# Patient Record
Sex: Male | Born: 1980 | Race: Black or African American | Hispanic: No | Marital: Married | Smoking: Never smoker
Health system: Southern US, Community
[De-identification: ages and names within clinical notes are randomized; demographics above are authoritative.]

## PROBLEM LIST (undated history)

## (undated) DIAGNOSIS — M109 Gout, unspecified: Secondary | ICD-10-CM

## (undated) DIAGNOSIS — I10 Essential (primary) hypertension: Secondary | ICD-10-CM

## (undated) HISTORY — DX: Essential (primary) hypertension: I10

---

## 2016-03-19 ENCOUNTER — Telehealth: Payer: Self-pay | Admitting: Cardiovascular Disease

## 2016-03-19 NOTE — Telephone Encounter (Signed)
Records received from [facility] for apt on 04/09/16 with Dr Duke Salviaandolph, [facility] (medical records) CN

## 2016-04-03 NOTE — Progress Notes (Signed)
Cardiology Office Note   Date:  04/04/2016   ID:  Steven Leon, DOB 07/20/1986, MRN [id number]  PCP:  Arnette Felts  Cardiologist:   Chilton Si, MD   Chief Complaint  Patient presents with  . New Evaluation    ref by Dr Allyne Gee for abnormal EKG, pt denied chest pain and SOB      History of Present Illness: Steven Leon is a 35 y.o. male who presents for evaluation Of an abnormal EKG.  Steven Leon saw Arnette Felts, FNP, on 03/12/16 and was noted to have an abnormal EKG. There was an incomplete right bundle branch block and lateral ST elevations. He was completely asymptomatic at the time. He was noted to have poorly controlled blood pressure and was started on azilsartan-chlorthalidone.  Prior to starting this medication he did note fatigue and headaches. His blood pressure at that appointment was 128/78.  It had been as high as 166/123.  Since starting the anti-hypertensive he has been feeling well. He has not noted any chest pain or shortness of breath. He also denies lower extremity edema, orthopnea, or PND. He does not exercise regularly but does either go running or playing basketball inconsistently. He denies exertional symptoms.    Steven Leon reports that he has been under a lot of stress lately.  Both his mother and mother-in-law are living with him and his wife. He has a daughter who is in college in [place] [employer].   Past Medical History:  Diagnosis Date  . Essential hypertension 04/04/2016  . Hypertension     History reviewed. No pertinent surgical history.   Current Outpatient Prescriptions  Medication Sig Dispense Refill  . Azilsartan-Chlorthalidone (EDARBYCLOR) 40-12.5 MG TABS Take 1 tablet by mouth daily.    . cetirizine (ZYRTEC) 10 MG tablet Take 10 mg by mouth daily as needed for allergies.     No current facility-administered medications for this visit.     Allergies:   Review of patient's allergies indicates no known allergies.    Social  History:  The patient  reports that he has never smoked. He has never used smokeless tobacco. He reports that he drinks about 0.6 oz of alcohol per week .   Family History:  The patient's family history includes Cancer in his maternal grandmother; Cancer - Prostate in his maternal grandfather; Diabetes in his maternal aunt, maternal grandmother, maternal uncle, mother, and paternal grandmother; Hypertension in his paternal grandmother; Seizures in his father.    ROS:  Please see the history of present illness.   Otherwise, review of systems are positive for none.   All other systems are reviewed and negative.    PHYSICAL EXAM: VS:  BP 117/83   Pulse 61   Ht 5\' 6"  (1.676 m)   Wt 220 lb 6.4 oz (100 kg)   BMI 35.57 kg/m  , BMI Body mass index is 35.57 kg/m. GENERAL:  Well appearing HEENT:  Pupils equal round and reactive, fundi not visualized, oral mucosa unremarkable NECK:  No jugular venous distention, waveform within normal limits, carotid upstroke brisk and symmetric, no bruits, no thyromegaly LYMPHATICS:  No cervical adenopathy LUNGS:  Clear to auscultation bilaterally HEART:  RRR.  PMI not displaced or sustained,S1 and S2 within normal limits, no S3, no S4, no clicks, no rubs, no murmurs ABD:  Flat, positive bowel sounds normal in frequency in pitch, no bruits, no rebound, no guarding, no midline pulsatile mass, no hepatomegaly, no splenomegaly EXT:  2 plus pulses throughout,  no edema, no cyanosis no clubbing SKIN:  No rashes no nodules NEURO:  Cranial nerves II through XII grossly intact, motor grossly intact throughout PSYCH:  Cognitively intact, oriented to person place and time   EKG:  EKG is ordered today. The ekg ordered today demonstrates sinus rhythm. Rate 61 bpm. Borderline LVH. 03/12/16: Sinus rhythm. Rate 63 bpm. Mild repolarization in the 90s.   Recent Labs: No results found for requested labs within last 8760 hours.    Lipid Panel No results found for: CHOL,  TRIG, HDL, CHOLHDL, VLDL, LDLCALC, LDLDIRECT    Wt Readings from Last 3 Encounters:  04/04/16 220 lb 6.4 oz (100 kg)      ASSESSMENT AND PLAN:  # Abnormal EKG: EKG is likely consistent with mild LVH in the setting of poorly-controlled hypertension.  There are no acute changes and he is completely   # Hypertension: Blood pressure is well-controlled on azilsartan and chlorthalidone.    Current medicines are reviewed at length with the patient today.  The patient does not have concerns regarding medicines.  The following changes have been made:  no change  Labs/ tests ordered today include:  No orders of the defined types were placed in this encounter.    Disposition:   FU with Nial Hawe C. Duke Salviaandolph, MD, [facility] as needed.     This note was written with the assistance of speech recognition software.  Please excuse any transcriptional errors.  Signed, Khai Arrona C. Duke Salviaandolph, MD, [facility]  04/04/2016 1:00 PM    [facility]

## 2016-04-04 ENCOUNTER — Encounter: Payer: Self-pay | Admitting: Cardiovascular Disease

## 2016-04-04 ENCOUNTER — Ambulatory Visit (INDEPENDENT_AMBULATORY_CARE_PROVIDER_SITE_OTHER): Payer: BLUE CROSS/BLUE SHIELD | Admitting: Cardiovascular Disease

## 2016-04-04 VITALS — BP 117/83 | HR 61 | Ht 66.0 in | Wt 220.4 lb

## 2016-04-04 DIAGNOSIS — I10 Essential (primary) hypertension: Secondary | ICD-10-CM | POA: Diagnosis not present

## 2016-04-04 DIAGNOSIS — R9431 Abnormal electrocardiogram [ECG] [EKG]: Secondary | ICD-10-CM

## 2016-04-04 HISTORY — DX: Essential (primary) hypertension: I10

## 2016-04-04 NOTE — Patient Instructions (Signed)
Medication Instructions:  ?Your physician recommends that you continue on your current medications as directed. Please refer to the Current Medication list given to you today.  ? ?Labwork: ?NONE ? ?Testing/Procedures: ?NONE ? ?Follow-Up: ?AS NEEDED  ? ?  ?

## 2018-03-06 DIAGNOSIS — Z6841 Body Mass Index (BMI) 40.0 and over, adult: Secondary | ICD-10-CM

## 2018-03-06 DIAGNOSIS — M109 Gout, unspecified: Secondary | ICD-10-CM | POA: Diagnosis not present

## 2018-03-06 DIAGNOSIS — Z0000 Encounter for general adult medical examination without abnormal findings: Secondary | ICD-10-CM

## 2018-03-06 DIAGNOSIS — I10 Essential (primary) hypertension: Secondary | ICD-10-CM

## 2018-03-06 DIAGNOSIS — E669 Obesity, unspecified: Secondary | ICD-10-CM | POA: Diagnosis not present

## 2018-09-04 ENCOUNTER — Encounter: Payer: Self-pay | Admitting: Nurse Practitioner

## 2018-09-04 ENCOUNTER — Ambulatory Visit: Payer: No Typology Code available for payment source | Admitting: Nurse Practitioner

## 2018-09-04 ENCOUNTER — Other Ambulatory Visit: Payer: Self-pay

## 2018-09-04 VITALS — BP 148/86 | Temp 98.2°F | Ht 66.0 in | Wt 234.2 lb

## 2018-09-04 DIAGNOSIS — I10 Essential (primary) hypertension: Secondary | ICD-10-CM | POA: Diagnosis not present

## 2018-09-04 MED ORDER — OLMESARTAN MEDOXOMIL-HCTZ 40-12.5 MG PO TABS: 1.0000 | ORAL_TABLET | Freq: Every day | ORAL | 1 refills | Status: DC

## 2018-09-04 NOTE — Progress Notes (Signed)
Subjective:     Patient ID: Steven Leon , male    DOB: Jan 03, 1981 , 38 y.o.   MRN: [id number]   Chief Complaint  Patient presents with  . Hypertension    HPI  Hypertension  This is a chronic problem. The current episode started more than 1 year ago. The problem has been gradually improving since onset. The problem is uncontrolled. Pertinent negatives include no anxiety or headaches. There are no associated agents to hypertension. Risk factors for coronary artery disease include obesity and male gender. Past treatments include angiotensin blockers and diuretics. The current treatment provides mild improvement. There are no compliance problems.  There is no history of angina. There is no history of chronic renal disease.     Past Medical History:  Diagnosis Date  . Essential hypertension 04/04/2016  . Hypertension      Family History  Problem Relation Age of Onset  . Diabetes Mother   . Seizures Father   . Cancer Maternal Grandmother   . Diabetes Maternal Grandmother   . Cancer - Prostate Maternal Grandfather   . Diabetes Paternal Grandmother   . Hypertension Paternal Grandmother   . Diabetes Maternal Aunt   . Diabetes Maternal Uncle      Current Outpatient Medications:  .  cetirizine (ZYRTEC) 10 MG tablet, Take 10 mg by mouth daily as needed for allergies., Disp: , Rfl:  .  Azilsartan-Chlorthalidone (EDARBYCLOR) 40-12.5 MG TABS, Take 1 tablet by mouth daily., Disp: , Rfl:    No Known Allergies   Review of Systems  Eyes: Negative.   Respiratory: Negative.   Cardiovascular: Negative.   Musculoskeletal: Negative.   Neurological: Negative for dizziness and headaches.  Psychiatric/Behavioral: Negative.      Today's Vitals   09/04/18 0955  BP: (!) 148/86  Temp: 98.2 F (36.8 C)  TempSrc: Oral  Weight: 234 lb 3.2 oz (106.2 kg)  Height: '5\' 6"'$  (1.676 m)   Body mass index is 37.8 kg/m.   Objective:  Physical Exam Vitals signs reviewed.  Constitutional:    Appearance: Normal appearance.  Cardiovascular:     Rate and Rhythm: Normal rate and regular rhythm.     Pulses: Normal pulses.     Heart sounds: Normal heart sounds. No murmur.  Pulmonary:     Effort: Pulmonary effort is normal. No respiratory distress.     Breath sounds: Normal breath sounds.  Skin:    Capillary Refill: Capillary refill takes less than 2 seconds.  Neurological:     General: No focal deficit present.  Psychiatric:        Mood and Affect: Mood normal.        Behavior: Behavior normal.        Thought Content: Thought content normal.        Judgment: Judgment normal.         Assessment And Plan:   1. Essential hypertension . B/P is poorly controlled  . BMP ordered to check renal function.  . I have advised him to limit his intake of salt and increase his water intake . The importance of regular exercise and dietary modification was stressed to the patient.  . Stressed importance of losing ten percent of her body weight to help with B/P control.  . The weight loss would help with decreasing cardiac and cancer risk as well.  - BMP8+eGFR - olmesartan-hydrochlorothiazide (BENICAR HCT) 40-12.5 MG tablet; Take 1 tablet by mouth daily.  Dispense: 90 tablet; Refill: [place], FNP

## 2018-09-05 LAB — BMP8+EGFR
BUN/Creatinine Ratio: 8 — ABNORMAL LOW (ref 9–20)
BUN: 9 mg/dL (ref 6–20)
CALCIUM: 9.2 mg/dL (ref 8.7–10.2)
CHLORIDE: 104 mmol/L (ref 96–106)
CO2: 24 mmol/L (ref 20–29)
CREATININE: 1.07 mg/dL (ref 0.76–1.27)
GFR calc Af Amer: 101 mL/min/{1.73_m2} (ref >59)
GFR calc non Af Amer: 87 mL/min/{1.73_m2} (ref >59)
GLUCOSE: 91 mg/dL (ref 65–99)
Potassium: 4.3 mmol/L (ref 3.5–5.2)
Sodium: 142 mmol/L (ref 134–144)

## 2018-09-10 ENCOUNTER — Telehealth: Payer: Self-pay

## 2018-09-10 NOTE — Telephone Encounter (Signed)
-----   Message from Arnette Felts, FNP sent at 09/10/2018 12:19 PM EDT ----- Kidney functions are normal.

## 2019-02-26 ENCOUNTER — Ambulatory Visit (HOSPITAL_COMMUNITY)
Admission: EM | Admit: 2019-02-26 | Discharge: 2019-02-26 | Disposition: A | Discharge status: Home / Self Care | Payer: 59 | Attending: Family Medicine | Admitting: Family Medicine

## 2019-02-26 ENCOUNTER — Ambulatory Visit (INDEPENDENT_AMBULATORY_CARE_PROVIDER_SITE_OTHER): Payer: 59

## 2019-02-26 ENCOUNTER — Encounter (HOSPITAL_COMMUNITY): Payer: Self-pay | Admitting: Emergency Medicine

## 2019-02-26 ENCOUNTER — Other Ambulatory Visit: Payer: Self-pay

## 2019-02-26 DIAGNOSIS — S93402A Sprain of unspecified ligament of left ankle, initial encounter: Secondary | ICD-10-CM | POA: Diagnosis not present

## 2019-02-26 HISTORY — DX: Gout, unspecified: M10.9

## 2019-02-26 MED ORDER — MELOXICAM 7.5 MG PO TBDP: 7.5000 mg | ORAL_TABLET | Freq: Every day | ORAL | 0 refills | Status: DC

## 2019-02-26 NOTE — Discharge Instructions (Signed)
Your findings do appear related to strain to the ankle, likely from your step while running.  Brace for support, may use ACE wrap as needed as well.  Ice, elevation for pain.  Meloxicam daily, take with food. Don't take additional ibuprofen.  I would recommend following up with podiatry or foot center for further recommendations as this may help with appropriate shoes/insoles for prevention of injury.  Light activity as tolerated, limit weight bearing until pain improves.

## 2019-02-26 NOTE — ED Provider Notes (Signed)
[facility]    CSN: [id number] Arrival date & time: 02/26/19  1447      History   Chief Complaint Chief Complaint  Patient presents with  . Foot Pain    HPI Steven Leon is a 38 y.o. male.   Steven Leon presents with complaints of left foot/ankle pain. Last week he started running again after he has been inactive for a longer period of time. Two days ago he noted slight pain but yesterday night he woke up with increased pain. Worse with weight bearing. Has been taking ibuprofen as well as colchicine which have helped some. Pain 5/10 with weight bearing, otherwise 2/10. Some swelling to lateral foot at ankle. No specific known injury. Has new running shoes. No numbness or tingling. No knee pain. Denies any previous ankle or foot injury. History  Of gout to right foot/toe/knee but states this feels different. He states his is "pigeon toed" and feels that when he runs he does have some inversion, with most of his weight landing on his lateral foot. He feels this is worse with his running shoes. History  Of htn, gout.     ROS per HPI, negative if not otherwise mentioned.      Past Medical History:  Diagnosis Date  . Essential hypertension 04/04/2016  . Gout   . Hypertension     Patient Active Problem List   Diagnosis Date Noted  . Essential hypertension 04/04/2016    History reviewed. No pertinent surgical history.     Home Medications    Prior to Admission medications   Medication Sig Start Date End Date Taking? Authorizing Provider  cetirizine (ZYRTEC) 10 MG tablet Take 10 mg by mouth daily as needed for allergies.    [provider]  colchicine 0.6 MG tablet TAKE 1 TABLET BY MOUTH BID IF NEEDED FOR GOUT 12/24/18   [provider]  Meloxicam 7.5 MG TBDP Take 7.5 mg by mouth daily. 02/26/19   Georgetta HaberBurky, Clearence Vitug B, NP  olmesartan-hydrochlorothiazide (BENICAR HCT) 40-12.5 MG tablet Take 1 tablet by mouth daily. 09/04/18   Arnette FeltsMoore, Janece, FNP     Family History Family History  Problem Relation Age of Onset  . Diabetes Mother   . Seizures Father   . Cancer Maternal Grandmother   . Diabetes Maternal Grandmother   . Cancer - Prostate Maternal Grandfather   . Diabetes Paternal Grandmother   . Hypertension Paternal Grandmother   . Diabetes Maternal Aunt   . Diabetes Maternal Uncle     Social History Social History   Tobacco Use  . Smoking status: Never Smoker  . Smokeless tobacco: Never Used  Substance Use Topics  . Alcohol use: Yes    Alcohol/week: 1.0 standard drinks    Types: 1 Cans of beer per week  . Drug use: Not on file     Allergies   Patient has no known allergies.   Review of Systems Review of Systems   Physical Exam Triage Vital Signs ED Triage Vitals  Enc Vitals Group     BP 02/26/19 1507 118/73     Pulse Rate 02/26/19 1507 93     Resp 02/26/19 1507 18     Temp 02/26/19 1507 98.4 F (36.9 C)     Temp src --      SpO2 02/26/19 1507 100 %     Weight --      Height --      Head Circumference --      Peak Flow --  Pain Score 02/26/19 1508 5     Pain Loc --      Pain Edu? --      Excl. in GC? --    No data found.  Updated Vital Signs BP 118/73   Pulse 93   Temp 98.4 F (36.9 C)   Resp 18   SpO2 100%   Visual Acuity Right Eye Distance:   Left Eye Distance:   Bilateral Distance:    Right Eye Near:   Left Eye Near:    Bilateral Near:     Physical Exam Constitutional:      Appearance: He is well-developed.  Cardiovascular:     Rate and Rhythm: Normal rate.  Pulmonary:     Effort: Pulmonary effort is normal.  Musculoskeletal:     Left ankle: He exhibits swelling. He exhibits normal range of motion, no ecchymosis, no deformity, no laceration and normal pulse.       Feet:     Comments: Soft tissue tenderness and swelling to lateral left ankle, pain with ankle extension, mild pain with dorsiflexion; no other foot pain or heel pain; no redness or warmth; no bony tenderness;  strong pedal pulse; cap refill < 2 seconds    Skin:    General: Skin is warm and dry.  Neurological:     Mental Status: He is alert and oriented to person, place, and time.      UC Treatments / Results  Labs (all labs ordered are listed, but only abnormal results are displayed) Labs Reviewed - No data to display  EKG   Radiology Dg Foot Complete Left  Result Date: 02/26/2019 CLINICAL DATA:  Foot pain EXAM: LEFT FOOT - COMPLETE 3+ VIEW COMPARISON:  None. FINDINGS: There is no evidence of fracture or dislocation. There is no evidence of arthropathy or other focal bone abnormality. Soft tissues are unremarkable. IMPRESSION: Negative. Electronically Signed   By: Katherine Mantle M.D.   On: 02/26/2019 15:46    Procedures Procedures (including critical care time)  Medications Ordered in UC Medications - No data to display  Initial Impression / Assessment and Plan / UC Course  I have reviewed the triage vital signs and the nursing notes.  Pertinent labs & imaging results that were available during my care of the patient were reviewed by me and considered in my medical decision making (see chart for details).     I feel his symptoms are likely related to strain to left ankle from abnormal gait with running. Likely needs different shoes/insoles for better support. Brace provided today, ice, elevation nsaids recommended. Follow up with podiatry as needed. Patient verbalized understanding and agreeable to plan.   Final Clinical Impressions(s) / UC Diagnoses   Final diagnoses:  Sprain of left ankle, unspecified ligament, initial encounter     Discharge Instructions     Your findings do appear related to strain to the ankle, likely from your step while running.  Brace for support, may use ACE wrap as needed as well.  Ice, elevation for pain.  Meloxicam daily, take with food. Don't take additional ibuprofen.  I would recommend following up with podiatry or foot center for  further recommendations as this may help with appropriate shoes/insoles for prevention of injury.  Light activity as tolerated, limit weight bearing until pain improves.    ED Prescriptions    Medication Sig Dispense Auth. Provider   Meloxicam 7.5 MG TBDP Take 7.5 mg by mouth daily. 20 tablet Georgetta Haber, NP  Controlled Substance Prescriptions [place] Controlled Substance Registry consulted? Not Applicable   Zigmund Gottron, NP 02/26/19 1556

## 2019-02-26 NOTE — ED Triage Notes (Addendum)
Pt states he started having L foot pain Wednesday that progressively got worse. Denies specific injury. Pt states hes been running a lot more and hes gotten new shoes, pt also has hx of gout, but not in his L foot. States it doesn't feel like gout. Pt took his gout medicine yesterday and today without relief.

## 2019-03-09 ENCOUNTER — Ambulatory Visit: Payer: 59 | Admitting: Nurse Practitioner

## 2019-03-09 ENCOUNTER — Other Ambulatory Visit: Payer: Self-pay

## 2019-03-09 ENCOUNTER — Encounter: Payer: Self-pay | Admitting: Nurse Practitioner

## 2019-03-09 VITALS — BP 132/88 | HR 62 | Temp 97.5°F | Ht 66.0 in | Wt 243.8 lb

## 2019-03-09 DIAGNOSIS — E669 Obesity, unspecified: Secondary | ICD-10-CM

## 2019-03-09 DIAGNOSIS — I10 Essential (primary) hypertension: Secondary | ICD-10-CM | POA: Diagnosis not present

## 2019-03-09 DIAGNOSIS — M25572 Pain in left ankle and joints of left foot: Secondary | ICD-10-CM

## 2019-03-09 DIAGNOSIS — Z0000 Encounter for general adult medical examination without abnormal findings: Secondary | ICD-10-CM

## 2019-03-09 DIAGNOSIS — E559 Vitamin D deficiency, unspecified: Secondary | ICD-10-CM

## 2019-03-09 DIAGNOSIS — Z1211 Encounter for screening for malignant neoplasm of colon: Secondary | ICD-10-CM

## 2019-03-09 LAB — POCT URINALYSIS DIPSTICK
Bilirubin, UA: NEGATIVE
Blood, UA: NEGATIVE
Glucose, UA: NEGATIVE
Ketones, UA: NEGATIVE
Leukocytes, UA: NEGATIVE
Nitrite, UA: NEGATIVE
Protein, UA: NEGATIVE
Spec Grav, UA: 1.025 (ref 1.010–1.025)
Urobilinogen, UA: 0.2 E.U./dL
pH, UA: 5.5 (ref 5.0–8.0)

## 2019-03-09 LAB — POCT UA - MICROALBUMIN
Albumin/Creatinine Ratio, Urine, POC: 30
Creatinine, POC: 300 mg/dL
Microalbumin Ur, POC: 10 mg/L

## 2019-03-09 NOTE — Progress Notes (Signed)
Subjective:     Patient ID: Steven Leon , male    DOB: 07/25/1980 , 38 y.o.   MRN: [id number]   Chief Complaint  Patient presents with  . Annual Exam    HPI Men's preventive visit. Patient Health Questionnaire (PHQ-2) is    Office Visit from 03/09/2019 in [facility]  PHQ-2 Total Score  0     Patient is on a regular diet. Marital status: Married. Relevant history for alcohol use is:  Social History   Substance and Sexual Activity  Alcohol Use Yes  . Alcohol/week: 1.0 standard drinks  . Types: 1 Cans of beer per week   Relevant history for tobacco use is:  Social History   Tobacco Use  Smoking Status Never Smoker  Smokeless Tobacco Never Used  Exercising - 1-2 times per week.  More on lower end, with brisk walks in the evening.   Here for HM.    3 weeks ago while running and did something to his left ankle.  Last Thursday was unable to put weight on it. Given medications and brace but continues to have pain.  If not better would like to go to orthopedics  Wt Readings from Last 3 Encounters: 03/09/19 : 243 lb 12.8 oz (110.6 kg) 09/04/18 : 234 lb 3.2 oz (106.2 kg) 04/04/16 : 220 lb 6.4 oz (100 kg)   Hypertension This is a chronic problem. The current episode started more than 1 year ago. The problem is unchanged. The problem is uncontrolled. Pertinent negatives include no anxiety, chest pain, headaches or palpitations. There are no associated agents to hypertension. Risk factors for coronary artery disease include obesity, sedentary lifestyle and male gender. Past treatments include angiotensin blockers and diuretics. There are no compliance problems.  There is no history of angina. There is no history of chronic renal disease or a hypertension causing med.     Past Medical History:  Diagnosis Date  . Essential hypertension 04/04/2016  . Gout   . Hypertension      Family History  Problem Relation Age of Onset  . Diabetes Mother   . Seizures  Father   . Cancer Maternal Grandmother   . Diabetes Maternal Grandmother   . Cancer - Prostate Maternal Grandfather   . Diabetes Paternal Grandmother   . Hypertension Paternal Grandmother   . Diabetes Maternal Aunt   . Diabetes Maternal Uncle      Current Outpatient Medications:  .  cetirizine (ZYRTEC) 10 MG tablet, Take 10 mg by mouth daily as needed for allergies., Disp: , Rfl:  .  colchicine 0.6 MG tablet, TAKE 1 TABLET BY MOUTH BID IF NEEDED FOR GOUT, Disp: , Rfl:  .  olmesartan-hydrochlorothiazide (BENICAR HCT) 40-12.5 MG tablet, Take 1 tablet by mouth daily., Disp: 90 tablet, Rfl: 1 .  Meloxicam 7.5 MG TBDP, Take 7.5 mg by mouth daily., Disp: 20 tablet, Rfl: 0   No Known Allergies   Review of Systems  Constitutional: Negative.   HENT: Negative.   Eyes: Negative.   Respiratory: Negative.   Cardiovascular: Negative.  Negative for chest pain, palpitations and leg swelling.  Endocrine: Negative.   Genitourinary: Negative.   Musculoskeletal: Negative.   Neurological: Negative.  Negative for dizziness and headaches.  Hematological: Negative.   Psychiatric/Behavioral: Negative.      Today's Vitals   03/09/19 0937  BP: 132/88  Pulse: 62  Temp: (!) 97.5 F (36.4 C)  TempSrc: Oral  Weight: 243 lb 12.8 oz (110.6 kg)  Height:  5\' 6"  (1.676 m)   Body mass index is 39.35 kg/m.   Objective:  Physical Exam Vitals signs reviewed.  Constitutional:      Appearance: Normal appearance. He is obese.  HENT:     Head: Normocephalic and atraumatic.     Right Ear: Tympanic membrane, ear canal and external ear normal. There is no impacted cerumen.     Left Ear: Tympanic membrane, ear canal and external ear normal. There is no impacted cerumen.  Eyes:     Extraocular Movements: Extraocular movements intact.     Conjunctiva/sclera: Conjunctivae normal.     Pupils: Pupils are equal, round, and reactive to light.  Neck:     Musculoskeletal: Normal range of motion and neck supple.   Cardiovascular:     Rate and Rhythm: Normal rate and regular rhythm.     Pulses: Normal pulses.     Heart sounds: Normal heart sounds. No murmur.  Pulmonary:     Effort: Pulmonary effort is normal. No respiratory distress.     Breath sounds: Normal breath sounds.  Abdominal:     General: Abdomen is flat. Bowel sounds are normal. There is no distension.     Palpations: Abdomen is soft.  Genitourinary:    Prostate: Normal.     Rectum: Guaiac result negative.  Musculoskeletal: Normal range of motion.        General: Tenderness (left ankle with brace in place) present.  Skin:    General: Skin is warm and dry.     Capillary Refill: Capillary refill takes less than 2 seconds.  Neurological:     General: No focal deficit present.     Mental Status: He is alert and oriented to person, place, and time.  Psychiatric:        Mood and Affect: Mood normal.        Behavior: Behavior normal.        Thought Content: Thought content normal.        Judgment: Judgment normal.         Assessment And Plan:     1. Health maintenance examination . Behavior modifications discussed and diet history reviewed.   . Pt will continue to exercise regularly and modify diet with low GI, plant based foods and decrease intake of processed foods.  . Recommend intake of daily multivitamin, Vitamin D, and calcium.  . Recommend for preventive screenings, as well as recommend immunizations that include influenza, TDAP  2. Essential hypertension  Chronic, fair control  Encouraged to make sure staying well hydrated  Will check GFR for kidney function  EKG, no changes to previous - POCT Urinalysis Dipstick (81002) - POCT UA - Microalbumin - EKG 12-Lead  3. Acute left ankle pain  Left ankle brace in place  He will see how he is feeling in another week will return call for referral to orthopedics  He was seen at urgent care approximately 2 weeks ago and thought to have an ankle sprain  4. Obesity  (BMI 30-39.9)  Chronic  9 lb weight gain since last visit  He is trying to make small changes  He recently started back exercising and injured his left foot   Arnette FeltsJanece Jene Huq, FNP    THE PATIENT IS ENCOURAGED TO PRACTICE SOCIAL DISTANCING DUE TO THE COVID-19 PANDEMIC.

## 2019-03-10 LAB — CMP14 + ANION GAP
ALT: 48 IU/L — ABNORMAL HIGH (ref 0–44)
AST: 29 IU/L (ref 0–40)
Albumin/Globulin Ratio: 1.3 (ref 1.2–2.2)
Albumin: 4.2 g/dL (ref 4.0–5.0)
Alkaline Phosphatase: 157 IU/L — ABNORMAL HIGH (ref 39–117)
Anion Gap: 16 mmol/L (ref 10.0–18.0)
BUN/Creatinine Ratio: 13 (ref 9–20)
BUN: 15 mg/dL (ref 6–20)
Bilirubin Total: 0.3 mg/dL (ref 0.0–1.2)
CO2: 23 mmol/L (ref 20–29)
Calcium: 9.5 mg/dL (ref 8.7–10.2)
Chloride: 102 mmol/L (ref 96–106)
Creatinine, Ser: 1.12 mg/dL (ref 0.76–1.27)
GFR calc Af Amer: 95 mL/min/{1.73_m2} (ref >59)
GFR calc non Af Amer: 82 mL/min/{1.73_m2} (ref >59)
Globulin, Total: 3.2 g/dL (ref 1.5–4.5)
Glucose: 88 mg/dL (ref 65–99)
Potassium: 4.3 mmol/L (ref 3.5–5.2)
Sodium: 141 mmol/L (ref 134–144)
Total Protein: 7.4 g/dL (ref 6.0–8.5)

## 2019-03-10 LAB — LIPID PANEL
Chol/HDL Ratio: 3 ratio (ref 0.0–5.0)
Cholesterol, Total: 153 mg/dL (ref 100–199)
HDL: 51 mg/dL (ref >39)
LDL Chol Calc (NIH): 80 mg/dL (ref 0–99)
Triglycerides: 121 mg/dL (ref 0–149)
VLDL Cholesterol Cal: 22 mg/dL (ref 5–40)

## 2019-03-10 LAB — HEMOGLOBIN A1C
Est. average glucose Bld gHb Est-mCnc: 126 mg/dL
Hgb A1c MFr Bld: 6 % — ABNORMAL HIGH (ref 4.8–5.6)

## 2019-03-10 LAB — VITAMIN D 25 HYDROXY (VIT D DEFICIENCY, FRACTURES): Vit D, 25-Hydroxy: 17.9 ng/mL — ABNORMAL LOW (ref 30.0–100.0)

## 2019-03-10 MED ORDER — VITAMIN D (ERGOCALCIFEROL) 1.25 MG (50000 UNIT) PO CAPS: 50000.0000 [IU] | ORAL_CAPSULE | ORAL | 0 refills | Status: DC

## 2019-03-12 ENCOUNTER — Encounter: Payer: Self-pay | Admitting: Nurse Practitioner

## 2019-03-15 NOTE — Patient Instructions (Signed)
Health Maintenance  Topic Date Due  . INFLUENZA VACCINE  09/22/2019 (Originally 01/23/2019)  . TETANUS/TDAP  03/12/2026  . HIV Screening  Completed   Health Maintenance, Male Adopting a healthy lifestyle and getting preventive care are important in promoting health and wellness. Ask your health care provider about:  The right schedule for you to have regular tests and exams.  Things you can do on your own to prevent diseases and keep yourself healthy. What should I know about diet, weight, and exercise? Eat a healthy diet   Eat a diet that includes plenty of vegetables, fruits, low-fat dairy products, and lean protein.  Do not eat a lot of foods that are high in solid fats, added sugars, or sodium. Maintain a healthy weight Body mass index (BMI) is a measurement that can be used to identify possible weight problems. It estimates body fat based on height and weight. Your health care provider can help determine your BMI and help you achieve or maintain a healthy weight. Get regular exercise Get regular exercise. This is one of the most important things you can do for your health. Most adults should:  Exercise for at least 150 minutes each week. The exercise should increase your heart rate and make you sweat (moderate-intensity exercise).  Do strengthening exercises at least twice a week. This is in addition to the moderate-intensity exercise.  Spend less time sitting. Even light physical activity can be beneficial. Watch cholesterol and blood lipids Have your blood tested for lipids and cholesterol at 39 years of age, then have this test every 5 years. You may need to have your cholesterol levels checked more often if:  Your lipid or cholesterol levels are high.  You are older than 39 years of age.  You are at high risk for heart disease. What should I know about cancer screening? Many types of cancers can be detected early and may often be prevented. Depending on your health  history and family history, you may need to have cancer screening at various ages. This may include screening for:  Colorectal cancer.  Prostate cancer.  Skin cancer.  Lung cancer. What should I know about heart disease, diabetes, and high blood pressure? Blood pressure and heart disease  High blood pressure causes heart disease and increases the risk of stroke. This is more likely to develop in people who have high blood pressure readings, are of African descent, or are overweight.  Talk with your health care provider about your target blood pressure readings.  Have your blood pressure checked: ? Every 3-5 years if you are 5318-39 years of age. ? Every year if you are 39 years old or older.  If you are between the ages of 2665 and 6175 and are a current or former smoker, ask your health care provider if you should have a one-time screening for abdominal aortic aneurysm (AAA). Diabetes Have regular diabetes screenings. This checks your fasting blood sugar level. Have the screening done:  Once every three years after age 145 if you are at a normal weight and have a low risk for diabetes.  More often and at a younger age if you are overweight or have a high risk for diabetes. What should I know about preventing infection? Hepatitis B If you have a higher risk for hepatitis B, you should be screened for this virus. Talk with your health care provider to find out if you are at risk for hepatitis B infection. Hepatitis C Blood testing is recommended for:  Everyone born from [birth year] through 1966.  Anyone with known risk factors for hepatitis C. Sexually transmitted infections (STIs)  You should be screened each year for STIs, including gonorrhea and chlamydia, if: ? You are sexually active and are younger than 39 years of age. ? You are older than 39 years of age and your health care provider tells you that you are at risk for this type of infection. ? Your sexual activity has changed since  you were last screened, and you are at increased risk for chlamydia or gonorrhea. Ask your health care provider if you are at risk.  Ask your health care provider about whether you are at high risk for HIV. Your health care provider may recommend a prescription medicine to help prevent HIV infection. If you choose to take medicine to prevent HIV, you should first get tested for HIV. You should then be tested every 3 months for as long as you are taking the medicine. Follow these instructions at home: Lifestyle  Do not use any products that contain nicotine or tobacco, such as cigarettes, e-cigarettes, and chewing tobacco. If you need help quitting, ask your health care provider.  Do not use street drugs.  Do not share needles.  Ask your health care provider for help if you need support or information about quitting drugs. Alcohol use  Do not drink alcohol if your health care provider tells you not to drink.  If you drink alcohol: ? Limit how much you have to 0-2 drinks a day. ? Be aware of how much alcohol is in your drink. In the [place] one drink equals one 12 oz bottle of beer (355 mL), one 5 oz glass of wine (148 mL), or one 1 oz glass of hard liquor (44 mL). General instructions  Schedule regular health, dental, and eye exams.  Stay current with your vaccines.  Tell your health care provider if: ? You often feel depressed. ? You have ever been abused or do not feel safe at home. Summary  Adopting a healthy lifestyle and getting preventive care are important in promoting health and wellness.  Follow your health care provider's instructions about healthy diet, exercising, and getting tested or screened for diseases.  Follow your health care provider's instructions on monitoring your cholesterol and blood pressure. This information is not intended to replace advice given to you by your health care provider. Make sure you discuss any questions you have with your health care  provider. Document Released: 12/07/2007 Document Revised: 06/03/2018 Document Reviewed: 06/03/2018 Elsevier Patient Education  2020 [employer].

## 2019-06-21 ENCOUNTER — Ambulatory Visit: Payer: Self-pay | Admitting: Nurse Practitioner

## 2019-06-22 ENCOUNTER — Ambulatory Visit: Payer: Self-pay | Admitting: Nurse Practitioner

## 2019-07-14 ENCOUNTER — Telehealth: Payer: Self-pay

## 2019-07-14 ENCOUNTER — Other Ambulatory Visit: Payer: Self-pay | Admitting: Nurse Practitioner

## 2019-07-14 DIAGNOSIS — I10 Essential (primary) hypertension: Secondary | ICD-10-CM

## 2019-07-14 NOTE — Telephone Encounter (Signed)
Left vm- pt needs to schedule appt for further refills

## 2019-08-24 ENCOUNTER — Other Ambulatory Visit: Payer: Self-pay | Admitting: Nurse Practitioner

## 2019-08-24 DIAGNOSIS — I10 Essential (primary) hypertension: Secondary | ICD-10-CM

## 2019-08-30 ENCOUNTER — Other Ambulatory Visit: Payer: Self-pay | Admitting: Nurse Practitioner

## 2019-08-30 DIAGNOSIS — M25569 Pain in unspecified knee: Secondary | ICD-10-CM

## 2019-08-30 MED ORDER — COLCHICINE 0.6 MG PO TABS: ORAL_TABLET | ORAL | 0 refills | Status: DC

## 2019-08-31 ENCOUNTER — Ambulatory Visit: Payer: 59 | Admitting: Nurse Practitioner

## 2019-08-31 ENCOUNTER — Encounter: Payer: Self-pay | Admitting: Nurse Practitioner

## 2019-08-31 ENCOUNTER — Other Ambulatory Visit: Payer: Self-pay

## 2019-08-31 VITALS — BP 124/80 | HR 87 | Temp 97.8°F | Ht 66.4 in | Wt 250.6 lb

## 2019-08-31 DIAGNOSIS — I10 Essential (primary) hypertension: Secondary | ICD-10-CM

## 2019-08-31 DIAGNOSIS — E559 Vitamin D deficiency, unspecified: Secondary | ICD-10-CM | POA: Diagnosis not present

## 2019-08-31 DIAGNOSIS — R7309 Other abnormal glucose: Secondary | ICD-10-CM

## 2019-08-31 DIAGNOSIS — M25569 Pain in unspecified knee: Secondary | ICD-10-CM

## 2019-08-31 MED ORDER — OLMESARTAN MEDOXOMIL-HCTZ 40-12.5 MG PO TABS: 1.0000 | ORAL_TABLET | Freq: Every day | ORAL | 1 refills | Status: DC

## 2019-08-31 MED ORDER — VITAMIN D (ERGOCALCIFEROL) 1.25 MG (50000 UNIT) PO CAPS: 50000.0000 [IU] | ORAL_CAPSULE | ORAL | 1 refills | Status: DC

## 2019-08-31 NOTE — Progress Notes (Signed)
This visit occurred during the SARS-CoV-2 public health emergency.  Safety protocols were in place, including screening questions prior to the visit, additional usage of staff PPE, and extensive cleaning of exam room while observing appropriate contact time as indicated for disinfecting solutions.  Subjective:     Patient ID: Steven Leon , male    DOB: 1981-05-22 , 39 y.o.   MRN: [id number]   Chief Complaint  Patient presents with  . Hypertension  . Knee Pain    patient went to [facility] and had his knee drained they told him they want him to have an ultrasound on his knee and an MRI    HPI  Wt Readings from Last 3 Encounters: 08/31/19 : 250 lb 9.6 oz (113.7 kg) 03/09/19 : 243 lb 12.8 oz (110.6 kg) 09/04/18 : 234 lb 3.2 oz (106.2 kg)   Hypertension This is a chronic problem. The current episode started more than 1 year ago. The problem is unchanged. The problem is controlled. Pertinent negatives include no anxiety, chest pain, headaches or palpitations. Risk factors for coronary artery disease include sedentary lifestyle.  Knee Pain  The incident occurred 3 to 5 days ago. Pain location: left knee swelling. The quality of the pain is described as aching. Treatments tried: went to orthopedic yesterday unable to have fluid removed and was given steroid injection. small amount of fluid removed. Want to have an ultrasound and MRI but is hesitant right now.      Past Medical History:  Diagnosis Date  . Essential hypertension 04/04/2016  . Gout   . Hypertension      Family History  Problem Relation Age of Onset  . Diabetes Mother   . Seizures Father   . Cancer Maternal Grandmother   . Diabetes Maternal Grandmother   . Cancer - Prostate Maternal Grandfather   . Diabetes Paternal Grandmother   . Hypertension Paternal Grandmother   . Diabetes Maternal Aunt   . Diabetes Maternal Uncle      Current Outpatient Medications:  .  cetirizine (ZYRTEC) 10 MG tablet, Take 10 mg by  mouth daily as needed for allergies., Disp: , Rfl:  .  colchicine 0.6 MG tablet, Take 1 tablet by mouth as needed BID, Disp: 30 tablet, Rfl: 0 .  olmesartan-hydrochlorothiazide (BENICAR HCT) 40-12.5 MG tablet, TAKE 1 TABLET BY MOUTH EVERY DAY, Disp: 30 tablet, Rfl: 0 .  Vitamin D, Ergocalciferol, (DRISDOL) 1.25 MG (50000 UT) CAPS capsule, Take 1 capsule (50,000 Units total) by mouth 2 (two) times a week. (Patient not taking: Reported on 08/31/2019), Disp: 24 capsule, Rfl: 0   No Known Allergies   Review of Systems  Constitutional: Negative.   Respiratory: Negative.   Cardiovascular: Negative.  Negative for chest pain, palpitations and leg swelling.  Musculoskeletal:       Left knee pain.    Neurological: Negative for dizziness and headaches.  Psychiatric/Behavioral: Negative.      Today's Vitals   08/31/19 1552  BP: 124/80  Pulse: 87  Temp: 97.8 F (36.6 C)  TempSrc: Oral  Weight: 250 lb 9.6 oz (113.7 kg)  Height: 5' 6.4" (1.687 m)  PainSc: 0-No pain   Body mass index is 39.96 kg/m.   Objective:  Physical Exam Constitutional:      General: He is not in acute distress.    Appearance: Normal appearance.  Cardiovascular:     Rate and Rhythm: Normal rate and regular rhythm.     Pulses: Normal pulses.     Heart  sounds: Normal heart sounds. No murmur.  Pulmonary:     Effort: Pulmonary effort is normal. No respiratory distress.     Breath sounds: Normal breath sounds.  Musculoskeletal:        General: Swelling (left knee) and tenderness (left knee tenderness) present.  Neurological:     General: No focal deficit present.     Mental Status: He is alert and oriented to person, place, and time.     Cranial Nerves: No cranial nerve deficit.  Psychiatric:        Mood and Affect: Mood normal.        Behavior: Behavior normal.        Thought Content: Thought content normal.        Judgment: Judgment normal.         Assessment And Plan:     1. Essential  hypertension  Chronic, good control   Continue with current medications  2. Vitamin D deficiency Will check vitamin D level and supplement as needed.    Also encouraged to spend 15 minutes in the sun daily.   3. Acute knee pain, unspecified laterality  Had steroid injection and attempt at fluid removal  4. Abnormal glucose  Chronic, HgbA1c was at 6 at last visit  Encouraged to continue to avoid sugary foods and drinks.  - CBC - Lipid panel - Hemoglobin A1c    [name], FNP    THE PATIENT IS ENCOURAGED TO PRACTICE SOCIAL DISTANCING DUE TO THE COVID-19 PANDEMIC.

## 2019-08-31 NOTE — Patient Instructions (Signed)
Take over the counter Magnesium 200 - 250 mg with evening meal.

## 2019-09-02 LAB — LIPID PANEL
Chol/HDL Ratio: 2.4 ratio (ref 0.0–5.0)
Cholesterol, Total: 151 mg/dL (ref 100–199)
HDL: 63 mg/dL (ref >39)
LDL Chol Calc (NIH): 71 mg/dL (ref 0–99)
Triglycerides: 92 mg/dL (ref 0–149)
VLDL Cholesterol Cal: 17 mg/dL (ref 5–40)

## 2019-09-02 LAB — CMP14+EGFR
ALT: 59 IU/L — ABNORMAL HIGH (ref 0–44)
AST: 42 IU/L — ABNORMAL HIGH (ref 0–40)
Albumin/Globulin Ratio: 1.2 (ref 1.2–2.2)
Albumin: 4.3 g/dL (ref 4.0–5.0)
Alkaline Phosphatase: 203 IU/L — ABNORMAL HIGH (ref 39–117)
BUN/Creatinine Ratio: 17 (ref 9–20)
BUN: 23 mg/dL (ref 6–24)
Bilirubin Total: 0.2 mg/dL (ref 0.0–1.2)
CO2: 18 mmol/L — ABNORMAL LOW (ref 20–29)
Calcium: 10.1 mg/dL (ref 8.7–10.2)
Chloride: 102 mmol/L (ref 96–106)
Creatinine, Ser: 1.39 mg/dL — ABNORMAL HIGH (ref 0.76–1.27)
GFR calc Af Amer: 73 mL/min/{1.73_m2} (ref >59)
GFR calc non Af Amer: 63 mL/min/{1.73_m2} (ref >59)
Globulin, Total: 3.6 g/dL (ref 1.5–4.5)
Glucose: 95 mg/dL (ref 65–99)
Potassium: 4.7 mmol/L (ref 3.5–5.2)
Sodium: 143 mmol/L (ref 134–144)
Total Protein: 7.9 g/dL (ref 6.0–8.5)

## 2019-09-02 LAB — CBC
Hematocrit: 40.5 % (ref 37.5–51.0)
Hemoglobin: 13.5 g/dL (ref 13.0–17.7)
MCH: 26.9 pg (ref 26.6–33.0)
MCHC: 33.3 g/dL (ref 31.5–35.7)
MCV: 81 fL (ref 79–97)
Platelets: 352 10*3/uL (ref 150–450)
RBC: 5.02 x10E6/uL (ref 4.14–5.80)
RDW: 14.6 % (ref 11.6–15.4)
WBC: 10.6 10*3/uL (ref 3.4–10.8)

## 2019-09-02 LAB — VITAMIN D 25 HYDROXY (VIT D DEFICIENCY, FRACTURES): Vit D, 25-Hydroxy: 23.8 ng/mL — ABNORMAL LOW (ref 30.0–100.0)

## 2019-09-02 LAB — HEMOGLOBIN A1C
Est. average glucose Bld gHb Est-mCnc: 126 mg/dL
Hgb A1c MFr Bld: 6 % — ABNORMAL HIGH (ref 4.8–5.6)

## 2019-09-06 ENCOUNTER — Ambulatory Visit: Payer: 59 | Admitting: Nurse Practitioner

## 2019-09-08 ENCOUNTER — Encounter: Payer: Self-pay | Admitting: Nurse Practitioner

## 2019-09-21 ENCOUNTER — Other Ambulatory Visit: Payer: Self-pay | Admitting: Nurse Practitioner

## 2019-09-21 DIAGNOSIS — I10 Essential (primary) hypertension: Secondary | ICD-10-CM

## 2019-12-09 ENCOUNTER — Other Ambulatory Visit: Payer: Self-pay | Admitting: Nurse Practitioner

## 2019-12-09 DIAGNOSIS — M25569 Pain in unspecified knee: Secondary | ICD-10-CM

## 2019-12-09 MED ORDER — COLCHICINE 0.6 MG PO TABS: ORAL_TABLET | ORAL | 0 refills | Status: DC

## 2019-12-13 ENCOUNTER — Encounter: Payer: Self-pay | Admitting: Nurse Practitioner

## 2019-12-13 ENCOUNTER — Other Ambulatory Visit: Payer: Self-pay

## 2019-12-13 ENCOUNTER — Ambulatory Visit: Payer: 59 | Admitting: Nurse Practitioner

## 2019-12-13 VITALS — BP 122/80 | HR 69 | Temp 97.7°F | Ht 67.0 in | Wt 239.0 lb

## 2019-12-13 DIAGNOSIS — M25571 Pain in right ankle and joints of right foot: Secondary | ICD-10-CM | POA: Diagnosis not present

## 2019-12-13 DIAGNOSIS — I10 Essential (primary) hypertension: Secondary | ICD-10-CM | POA: Diagnosis not present

## 2019-12-13 DIAGNOSIS — R7309 Other abnormal glucose: Secondary | ICD-10-CM | POA: Diagnosis not present

## 2019-12-13 DIAGNOSIS — M1049 Other secondary gout, multiple sites: Secondary | ICD-10-CM

## 2019-12-13 MED ORDER — ALLOPURINOL 100 MG PO TABS: 100.0000 mg | ORAL_TABLET | Freq: Every day | ORAL | 2 refills | Status: DC

## 2019-12-13 MED ORDER — OLMESARTAN MEDOXOMIL 40 MG PO TABS: 40.0000 mg | ORAL_TABLET | Freq: Every day | ORAL | 1 refills | Status: DC

## 2019-12-13 MED ORDER — AMLODIPINE BESYLATE 5 MG PO TABS: 5.0000 mg | ORAL_TABLET | Freq: Every day | ORAL | 1 refills | Status: DC

## 2019-12-13 NOTE — Progress Notes (Signed)
This visit occurred during the SARS-CoV-2 public health emergency.  Safety protocols were in place, including screening questions prior to the visit, additional usage of staff PPE, and extensive cleaning of exam room while observing appropriate contact time as indicated for disinfecting solutions.  Subjective:     Patient ID: Steven Leon , male    DOB: 10/13/1980 , 39 y.o.   MRN: [id number]   Chief Complaint  Patient presents with  . Foot Pain    patient stated his foot was hurting tuesday- saturday. he stated his foot is swollen but he is no longer having pain     HPI  Steven Leon presents today with a complaint of foot pain that started last Tuesday that was an 8/10. The pain was located on the dorsal portion and bottom of the foot. He stated that he ate some fish tacos on Monday and then the had the pain in the foot. He says that he is eating only one meal a day and has little water intake. We discussed his diet and water intake and the need to avoid high sodium foods.  Wt Readings from Last 3 Encounters: 12/13/19 : 239 lb (108.4 kg) 08/31/19 : 250 lb 9.6 oz (113.7 kg) 03/09/19 : 243 lb 12.8 oz (110.6 kg)   Hypertension This is a chronic problem. The current episode started more than 1 year ago. The problem is unchanged. The problem is controlled. Pertinent negatives include no anxiety. Risk factors for coronary artery disease include sedentary lifestyle. Past treatments include angiotensin blockers and diuretics. There are no compliance problems.  There is no history of angina. There is no history of chronic renal disease.  Foot Pain This is a recurrent problem. The current episode started in the past 7 days. The problem has been unchanged. Associated symptoms include joint swelling.     Past Medical History:  Diagnosis Date  . Essential hypertension 04/04/2016  . Gout   . Hypertension      Family History  Problem Relation Age of Onset  . Diabetes Mother   . Seizures  Father   . Cancer Maternal Grandmother   . Diabetes Maternal Grandmother   . Cancer - Prostate Maternal Grandfather   . Diabetes Paternal Grandmother   . Hypertension Paternal Grandmother   . Diabetes Maternal Aunt   . Diabetes Maternal Uncle      Current Outpatient Medications:  .  cetirizine (ZYRTEC) 10 MG tablet, Take 10 mg by mouth daily as needed for allergies., Disp: , Rfl:  .  colchicine 0.6 MG tablet, Take 1 tablet by mouth as needed BID, Disp: 30 tablet, Rfl: 0 .  olmesartan-hydrochlorothiazide (BENICAR HCT) 40-12.5 MG tablet, TAKE 1 TABLET BY MOUTH EVERY DAY, Disp: 90 tablet, Rfl: 0 .  Vitamin D, Ergocalciferol, (DRISDOL) 1.25 MG (50000 UNIT) CAPS capsule, Take 1 capsule (50,000 Units total) by mouth 2 (two) times a week. (Patient not taking: Reported on 12/13/2019), Disp: 24 capsule, Rfl: 1   No Known Allergies   Review of Systems  Constitutional: Negative.   HENT: Negative.   Respiratory: Negative.   Gastrointestinal: Negative.   Endocrine: Negative.   Musculoskeletal: Positive for joint swelling.       Right Ankle and left knee is chronic  Skin: Negative.   Neurological: Negative.   Hematological: Negative.   Psychiatric/Behavioral: Negative.      Today's Vitals   12/13/19 0819  BP: 122/80  Pulse: 69  Temp: 97.7 F (36.5 C)  TempSrc: Oral  Weight: 239 lb (108.4  kg)  Height: _0  (1.702 m)  PainSc: 0-No pain   Body mass index is 37.43 kg/m.   Objective:  Physical Exam Constitutional:      Appearance: Normal appearance. He is normal weight.  Cardiovascular:     Rate and Rhythm: Normal rate and regular rhythm.     Pulses: Normal pulses.     Heart sounds: Normal heart sounds.  Pulmonary:     Effort: Pulmonary effort is normal.     Breath sounds: Normal breath sounds.  Musculoskeletal:        General: Swelling and tenderness present.     Comments: Right ankle  Skin:    General: Skin is warm and dry.  Neurological:     General: No focal deficit  present.     Mental Status: He is alert and oriented to person, place, and time.  Psychiatric:        Mood and Affect: Mood normal.        Behavior: Behavior normal.        Thought Content: Thought content normal.        Judgment: Judgment normal.         Assessment And Plan:     1. Other secondary acute gout of multiple sites  Improving after taking ibuprofen and colchicine  Encouraged to increase his water intake.  Will stop his HCTZ   - allopurinol (ZYLOPRIM) 100 MG tablet; Take 1 tablet (100 mg total) by mouth daily.  Dispense: 30 tablet; Refill: 2  2. Essential hypertension  Chronic, stable.  Continue with current medications - olmesartan (BENICAR) 40 MG tablet; Take 1 tablet (40 mg total) by mouth daily.  Dispense: 90 tablet; Refill: 1 - amLODipine (NORVASC) 5 MG tablet; Take 1 tablet (5 mg total) by mouth daily.  Dispense: 90 tablet; Refill: 1 - BMP8+eGFR - Hemoglobin A1c  3. Abnormal glucose  Chronic, stable  Continue to avoid sugary foods and drinks.  - Hemoglobin A1c     Steven Lund, RN    Steven Brine, DNP, FNP-BC   THE PATIENT IS ENCOURAGED TO PRACTICE SOCIAL DISTANCING DUE TO THE COVID-19 PANDEMIC.

## 2019-12-14 LAB — BMP8+EGFR
BUN/Creatinine Ratio: 11 (ref 9–20)
BUN: 14 mg/dL (ref 6–24)
CO2: 23 mmol/L (ref 20–29)
Calcium: 9.8 mg/dL (ref 8.7–10.2)
Chloride: 100 mmol/L (ref 96–106)
Creatinine, Ser: 1.26 mg/dL (ref 0.76–1.27)
GFR calc Af Amer: 82 mL/min/{1.73_m2} (ref >59)
GFR calc non Af Amer: 71 mL/min/{1.73_m2} (ref >59)
Glucose: 89 mg/dL (ref 65–99)
Potassium: 4.4 mmol/L (ref 3.5–5.2)
Sodium: 137 mmol/L (ref 134–144)

## 2019-12-14 LAB — HEMOGLOBIN A1C
Est. average glucose Bld gHb Est-mCnc: 120 mg/dL
Hgb A1c MFr Bld: 5.8 % — ABNORMAL HIGH (ref 4.8–5.6)

## 2019-12-16 ENCOUNTER — Ambulatory Visit: Payer: 59 | Attending: Internal Medicine

## 2019-12-16 DIAGNOSIS — Z23 Encounter for immunization: Secondary | ICD-10-CM

## 2019-12-16 NOTE — Progress Notes (Signed)
   Covid-19 Vaccination Clinic  Name:  Lesslie Mckeehan    MRN: [id number] DOB: [birth year]  12/16/2019  Mr. Pippen was observed post Covid-19 immunization for 15 minutes without incident. He was provided with Vaccine Information Sheet and instruction to access the V-Safe system.   Mr. Probert was instructed to call 911 with any severe reactions post vaccine: Marland Kitchen Difficulty breathing  . Swelling of face and throat  . A fast heartbeat  . A bad rash all over body  . Dizziness and weakness   Immunizations Administered    Name Date Dose VIS Date Route   Pfizer COVID-19 Vaccine 12/16/2019 11:31 AM 0.3 mL 08/18/2018 Intramuscular   Manufacturer: [employer], [employer]   Lot: [id number]   NDC: [address]-9

## 2020-01-06 ENCOUNTER — Ambulatory Visit: Payer: 59 | Attending: Internal Medicine

## 2020-01-06 DIAGNOSIS — Z23 Encounter for immunization: Secondary | ICD-10-CM

## 2020-01-06 NOTE — Progress Notes (Signed)
   Covid-19 Vaccination Clinic  Name:  Steven Leon    MRN: [id number] DOB: 1981/06/04  01/06/2020  [name] was observed post Covid-19 immunization for 15 minutes without incident. He was provided with Vaccine Information Sheet and instruction to access the V-Safe system.   [name] was instructed to call 911 with any severe reactions post vaccine: Marland Kitchen Difficulty breathing  . Swelling of face and throat  . A fast heartbeat  . A bad rash all over body  . Dizziness and weakness   Immunizations Administered    Name Date Dose VIS Date Route   Pfizer COVID-19 Vaccine 01/06/2020 11:36 AM 0.3 mL 08/18/2018 Intramuscular   Manufacturer: [employer], [employer]   Lot: [id number]   NDC: [address]-7

## 2020-01-13 ENCOUNTER — Encounter: Payer: Self-pay | Admitting: Nurse Practitioner

## 2020-01-13 ENCOUNTER — Other Ambulatory Visit: Payer: Self-pay

## 2020-01-13 ENCOUNTER — Ambulatory Visit: Payer: 59 | Admitting: Nurse Practitioner

## 2020-01-13 VITALS — BP 122/84 | HR 60 | Temp 97.4°F | Ht 67.2 in | Wt 240.0 lb

## 2020-01-13 DIAGNOSIS — I10 Essential (primary) hypertension: Secondary | ICD-10-CM

## 2020-01-13 DIAGNOSIS — M1049 Other secondary gout, multiple sites: Secondary | ICD-10-CM | POA: Diagnosis not present

## 2020-01-13 NOTE — Progress Notes (Signed)
This visit occurred during the SARS-CoV-2 public health emergency.  Safety protocols were in place, including screening questions prior to the visit, additional usage of staff PPE, and extensive cleaning of exam room while observing appropriate contact time as indicated for disinfecting solutions.  Subjective:     Patient ID: Steven Leon , male    DOB: 02/24/1981 , 39 y.o.   MRN: [id number]   Chief Complaint  Patient presents with  . Follow-up    medication  . Edema    (L) ankle     HPI  He is doing well with taking the Allopurinol, the last 4-5 days has had a little bit of swelling to his ankles.   He has had his covid vaccine after the first one had leg pain.      Past Medical History:  Diagnosis Date  . Essential hypertension 04/04/2016  . Gout   . Hypertension      Family History  Problem Relation Age of Onset  . Diabetes Mother   . Seizures Father   . Cancer Maternal Grandmother   . Diabetes Maternal Grandmother   . Cancer - Prostate Maternal Grandfather   . Diabetes Paternal Grandmother   . Hypertension Paternal Grandmother   . Diabetes Maternal Aunt   . Diabetes Maternal Uncle      Current Outpatient Medications:  .  allopurinol (ZYLOPRIM) 100 MG tablet, Take 1 tablet (100 mg total) by mouth daily., Disp: 30 tablet, Rfl: 2 .  amLODipine (NORVASC) 5 MG tablet, Take 1 tablet (5 mg total) by mouth daily., Disp: 90 tablet, Rfl: 1 .  cetirizine (ZYRTEC) 10 MG tablet, Take 10 mg by mouth daily as needed for allergies., Disp: , Rfl:  .  colchicine 0.6 MG tablet, Take 1 tablet by mouth as needed BID, Disp: 30 tablet, Rfl: 0 .  Vitamin D, Ergocalciferol, (DRISDOL) 1.25 MG (50000 UNIT) CAPS capsule, Take 1 capsule (50,000 Units total) by mouth 2 (two) times a week. (Patient not taking: Reported on 12/13/2019), Disp: 24 capsule, Rfl: 1   No Known Allergies   Review of Systems  Constitutional: Negative.   Respiratory: Negative.   Cardiovascular: Negative.  Negative  for chest pain, palpitations and leg swelling.  Musculoskeletal:       Slight swelling to left ankle.   Psychiatric/Behavioral: Negative.      Today's Vitals   01/13/20 1156  BP: 122/84  Pulse: 60  Temp: (!) 97.4 F (36.3 C)  TempSrc: Oral  Weight: (!) 240 lb (108.9 kg)  Height: 5' 7.2" (1.707 m)  PainSc: 0-No pain   Body mass index is 37.37 kg/m.  Wt Readings from Last 3 Encounters:  01/13/20 (!) 240 lb (108.9 kg)  12/13/19 239 lb (108.4 kg)  08/31/19 250 lb 9.6 oz (113.7 kg)    Objective:  Physical Exam Constitutional:      General: He is not in acute distress.    Appearance: Normal appearance.  Pulmonary:     Effort: Pulmonary effort is normal.  Neurological:     General: No focal deficit present.     Mental Status: He is alert and oriented to person, place, and time.  Psychiatric:        Mood and Affect: Mood normal.        Behavior: Behavior normal.        Thought Content: Thought content normal.        Judgment: Judgment normal.         Assessment And Plan:  1. Essential hypertension  Blood pressure is controlled without HCTZ and doing well with amlodipine    2. Other secondary acute gout of multiple sites  Has not had any exacerbations since his last visit and starting allopurinol  Tolerating medication well.     Patient was given opportunity to ask questions. Patient verbalized understanding of the plan and was able to repeat key elements of the plan. All questions were answered to their satisfaction.  Arnette Felts, FNP   I, Arnette Felts, FNP, have reviewed all documentation for this visit. The documentation on 01/13/20 for the exam, diagnosis, procedures, and orders are all accurate and complete.   THE PATIENT IS ENCOURAGED TO PRACTICE SOCIAL DISTANCING DUE TO THE COVID-19 PANDEMIC.

## 2020-01-24 ENCOUNTER — Encounter: Payer: Self-pay | Admitting: Nurse Practitioner

## 2020-01-25 ENCOUNTER — Other Ambulatory Visit: Payer: Self-pay

## 2020-01-25 ENCOUNTER — Encounter: Payer: Self-pay | Admitting: Nurse Practitioner

## 2020-01-25 ENCOUNTER — Ambulatory Visit: Payer: 59 | Admitting: Nurse Practitioner

## 2020-01-25 VITALS — BP 132/86 | HR 76 | Temp 97.9°F | Ht 67.2 in | Wt 241.4 lb

## 2020-01-25 DIAGNOSIS — M25471 Effusion, right ankle: Secondary | ICD-10-CM | POA: Diagnosis not present

## 2020-01-25 DIAGNOSIS — I10 Essential (primary) hypertension: Secondary | ICD-10-CM

## 2020-01-25 DIAGNOSIS — M25472 Effusion, left ankle: Secondary | ICD-10-CM

## 2020-01-25 DIAGNOSIS — M25474 Effusion, right foot: Secondary | ICD-10-CM

## 2020-01-25 DIAGNOSIS — Z1159 Encounter for screening for other viral diseases: Secondary | ICD-10-CM

## 2020-01-25 DIAGNOSIS — M1049 Other secondary gout, multiple sites: Secondary | ICD-10-CM

## 2020-01-25 DIAGNOSIS — M25475 Effusion, left foot: Secondary | ICD-10-CM

## 2020-01-25 MED ORDER — LOSARTAN POTASSIUM 50 MG PO TABS: 50.0000 mg | ORAL_TABLET | Freq: Every day | ORAL | 2 refills | Status: DC

## 2020-01-25 NOTE — Patient Instructions (Addendum)
Gout  Gout is painful swelling of your joints. Gout is a type of arthritis. It is caused by having too much uric acid in your body. Uric acid is a chemical that is made when your body breaks down substances called purines. If your body has too much uric acid, sharp crystals can form and build up in your joints. This causes pain and swelling. Gout attacks can happen quickly and be very painful (acute gout). Over time, the attacks can affect more joints and happen more often (chronic gout). What are the causes?  Too much uric acid in your blood. This can happen because: ? Your kidneys do not remove enough uric acid from your blood. ? Your body makes too much uric acid. ? You eat too many foods that are high in purines. These foods include organ meats, some seafood, and beer.  Trauma or stress. What increases the risk?  Having a family history of gout.  Being male and middle-aged.  Being male and having gone through menopause.  Being very overweight (obese).  Drinking alcohol, especially beer.  Not having enough water in the body (being dehydrated).  Losing weight too quickly.  Having an organ transplant.  Having lead poisoning.  Taking certain medicines.  Having kidney disease.  Having a skin condition called psoriasis. What are the signs or symptoms? An attack of acute gout usually happens in just one joint. The most common place is the big toe. Attacks often start at night. Other joints that may be affected include joints of the feet, ankle, knee, fingers, wrist, or elbow. Symptoms of an attack may include:  Very bad pain.  Warmth.  Swelling.  Stiffness.  Shiny, red, or purple skin.  Tenderness. The affected joint may be very painful to touch.  Chills and fever. Chronic gout may cause symptoms more often. More joints may be involved. You may also have white or yellow lumps (tophi) on your hands or feet or in other areas near your joints. How is this  treated?  Treatment for this condition has two phases: treating an acute attack and preventing future attacks.  Acute gout treatment may include: ? NSAIDs. ? Steroids. These are taken by mouth or injected into a joint. ? Colchicine. This medicine relieves pain and swelling. It can be given by mouth or through an IV tube.  Preventive treatment may include: ? Taking small doses of NSAIDs or colchicine daily. ? Using a medicine that reduces uric acid levels in your blood. ? Making changes to your diet. You may need to see a food expert (dietitian) about what to eat and drink to prevent gout. Follow these instructions at home: During a gout attack   If told, put ice on the painful area: ? Put ice in a plastic bag. ? Place a towel between your skin and the bag. ? Leave the ice on for 20 minutes, 2-3 times a day.  Raise (elevate) the painful joint above the level of your heart as often as you can.  Rest the joint as much as possible. If the joint is in your leg, you may be given crutches.  Follow instructions from your doctor about what you cannot eat or drink. Avoiding future gout attacks  Eat a low-purine diet. Avoid foods and drinks such as: ? Liver. ? Kidney. ? Anchovies. ? Asparagus. ? Herring. ? Mushrooms. ? Mussels. ? Beer.  Stay at a healthy weight. If you want to lose weight, talk with your doctor. Do not lose weight   too fast.  Start or continue an exercise plan as told by your doctor. Eating and drinking  Drink enough fluids to keep your pee (urine) pale yellow.  If you drink alcohol: ? Limit how much you use to:  0-1 drink a day for women.  0-2 drinks a day for men. ? Be aware of how much alcohol is in your drink. In the [place] one drink equals one 12 oz bottle of beer (355 mL), one 5 oz glass of wine (148 mL), or one 1 oz glass of hard liquor (44 mL). General instructions  Take over-the-counter and prescription medicines only as told by your doctor.  Do  not drive or use heavy machinery while taking prescription pain medicine.  Return to your normal activities as told by your doctor. Ask your doctor what activities are safe for you.  Keep all follow-up visits as told by your doctor. This is important. Contact a doctor if:  You have another gout attack.  You still have symptoms of a gout attack after 10 days of treatment.  You have problems (side effects) because of your medicines.  You have chills or a fever.  You have burning pain when you pee (urinate).  You have pain in your lower back or belly. Get help right away if:  You have very bad pain.  Your pain cannot be controlled.  You cannot pee. Summary  Gout is painful swelling of the joints.  The most common site of pain is the big toe, but it can affect other joints.  Medicines and avoiding some foods can help to prevent and treat gout attacks. This information is not intended to replace advice given to you by your health care provider. Make sure you discuss any questions you have with your health care provider. Document Revised: 12/31/2017 Document Reviewed: 12/31/2017 Elsevier Patient Education  2020 [employer].  Encouraged to eliminate foods such as beef and chicken.

## 2020-01-25 NOTE — Progress Notes (Signed)
This visit occurred during the SARS-CoV-2 public health emergency.  Safety protocols were in place, including screening questions prior to the visit, additional usage of staff PPE, and extensive cleaning of exam room while observing appropriate contact time as indicated for disinfecting solutions.  Subjective:     Patient ID: Steven Leon , male    DOB: 1981/01/15 , 39 y.o.   MRN: [id number]   Chief Complaint  Patient presents with  . Edema    patient stated he is having some swelling in his ankle and feet that started about 2 weeks ago it first started out in his left foot. he reports some pain in his heel    HPI  Here today reported having bilateral foot swelling with the right foot pain.  He has been drinking a good amount of water and had one glass of wine last Tuesday.  He has been eating more honey roasted nuts.  He has stopped eating the nuts for now.  He was having difficulty with flexing his right foot/ankle. When he puts his body weight on the foot he has discomfort.      Past Medical History:  Diagnosis Date  . Essential hypertension 04/04/2016  . Gout   . Hypertension      Family History  Problem Relation Age of Onset  . Diabetes Mother   . Seizures Father   . Cancer Maternal Grandmother   . Diabetes Maternal Grandmother   . Cancer - Prostate Maternal Grandfather   . Diabetes Paternal Grandmother   . Hypertension Paternal Grandmother   . Diabetes Maternal Aunt   . Diabetes Maternal Uncle      Current Outpatient Medications:  .  allopurinol (ZYLOPRIM) 100 MG tablet, Take 1 tablet (100 mg total) by mouth daily., Disp: 30 tablet, Rfl: 2 .  cetirizine (ZYRTEC) 10 MG tablet, Take 10 mg by mouth daily as needed for allergies., Disp: , Rfl:  .  colchicine 0.6 MG tablet, Take 1 tablet by mouth as needed BID, Disp: 30 tablet, Rfl: 0 .  Vitamin D, Ergocalciferol, (DRISDOL) 1.25 MG (50000 UNIT) CAPS capsule, Take 1 capsule (50,000 Units total) by mouth 2 (two) times a  week., Disp: 24 capsule, Rfl: 1 .  losartan (COZAAR) 50 MG tablet, Take 1 tablet (50 mg total) by mouth daily., Disp: 30 tablet, Rfl: 2   No Known Allergies   Review of Systems  Constitutional: Negative.  Negative for fatigue.  Respiratory: Negative.   Cardiovascular: Positive for leg swelling.  Endocrine: Negative for polydipsia, polyphagia and polyuria.  Musculoskeletal: Negative.        Left heel pain  Skin: Negative.   Neurological: Negative for dizziness and headaches.  Psychiatric/Behavioral: Negative.      Today's Vitals   01/25/20 0906  BP: 132/86  Pulse: 76  Temp: 97.9 F (36.6 C)  TempSrc: Oral  Weight: 241 lb 6.4 oz (109.5 kg)  Height: 5' 7.2" (1.707 m)  PainSc: 7   PainLoc: Foot   Body mass index is 37.58 kg/m.   Objective:  Physical Exam Constitutional:      Appearance: Normal appearance. He is obese.  Cardiovascular:     Rate and Rhythm: Normal rate and regular rhythm.     Pulses: Normal pulses.     Heart sounds: Normal heart sounds. No murmur heard.   Pulmonary:     Effort: Pulmonary effort is normal. No respiratory distress.     Breath sounds: Normal breath sounds.  Musculoskeletal:     Right lower  leg: Edema (tender to touch dorsal surface of foot) present.     Left lower leg: Edema present.  Skin:    General: Skin is warm and dry.  Neurological:     General: No focal deficit present.     Mental Status: He is alert and oriented to person, place, and time.     Cranial Nerves: No cranial nerve deficit.  Psychiatric:        Mood and Affect: Mood normal.        Behavior: Behavior normal.        Thought Content: Thought content normal.        Judgment: Judgment normal.         Assessment And Plan:     1. Bilateral swelling of feet and ankles  Right is more swollen than left  This may be a reaction to his amlodipine vs gout, will change amlodipine to losartan  2. Essential hypertension  Currently controlled, changed to losartan and he  is to check his blood pressure at home and call with readings  - losartan (COZAAR) 50 MG tablet; Take 1 tablet (50 mg total) by mouth daily.  Dispense: 30 tablet; Refill: 2  3. Other secondary acute gout of multiple sites  Will check uric acid in the event the swelling is related to a gout flare - Uric acid   Discussed previous labs during office visit  Patient was given opportunity to ask questions. Patient verbalized understanding of the plan and was able to repeat key elements of the plan. All questions were answered to their satisfaction.  Arnette Felts, FNP   I, Arnette Felts, FNP, have reviewed all documentation for this visit. The documentation on 01/25/20 for the exam, diagnosis, procedures, and orders are all accurate and complete.   THE PATIENT IS ENCOURAGED TO PRACTICE SOCIAL DISTANCING DUE TO THE COVID-19 PANDEMIC.

## 2020-01-26 ENCOUNTER — Other Ambulatory Visit: Payer: Self-pay | Admitting: Internal Medicine

## 2020-01-26 DIAGNOSIS — M25569 Pain in unspecified knee: Secondary | ICD-10-CM

## 2020-01-26 LAB — URIC ACID: Uric Acid: 6 mg/dL (ref 3.8–8.4)

## 2020-01-26 LAB — HEPATITIS C ANTIBODY: Hep C Virus Ab: <0.1 s/co ratio (ref 0.0–0.9)

## 2020-01-26 MED ORDER — COLCHICINE 0.6 MG PO TABS: ORAL_TABLET | ORAL | 0 refills | Status: DC

## 2020-02-26 IMAGING — DX DG FOOT COMPLETE 3+V*L*
3 series · 3 of 3 positions shown · non-contrast
Comparison: None.

CLINICAL DATA: Foot pain

EXAM:
LEFT FOOT - COMPLETE 3+ VIEW

[foot ap]
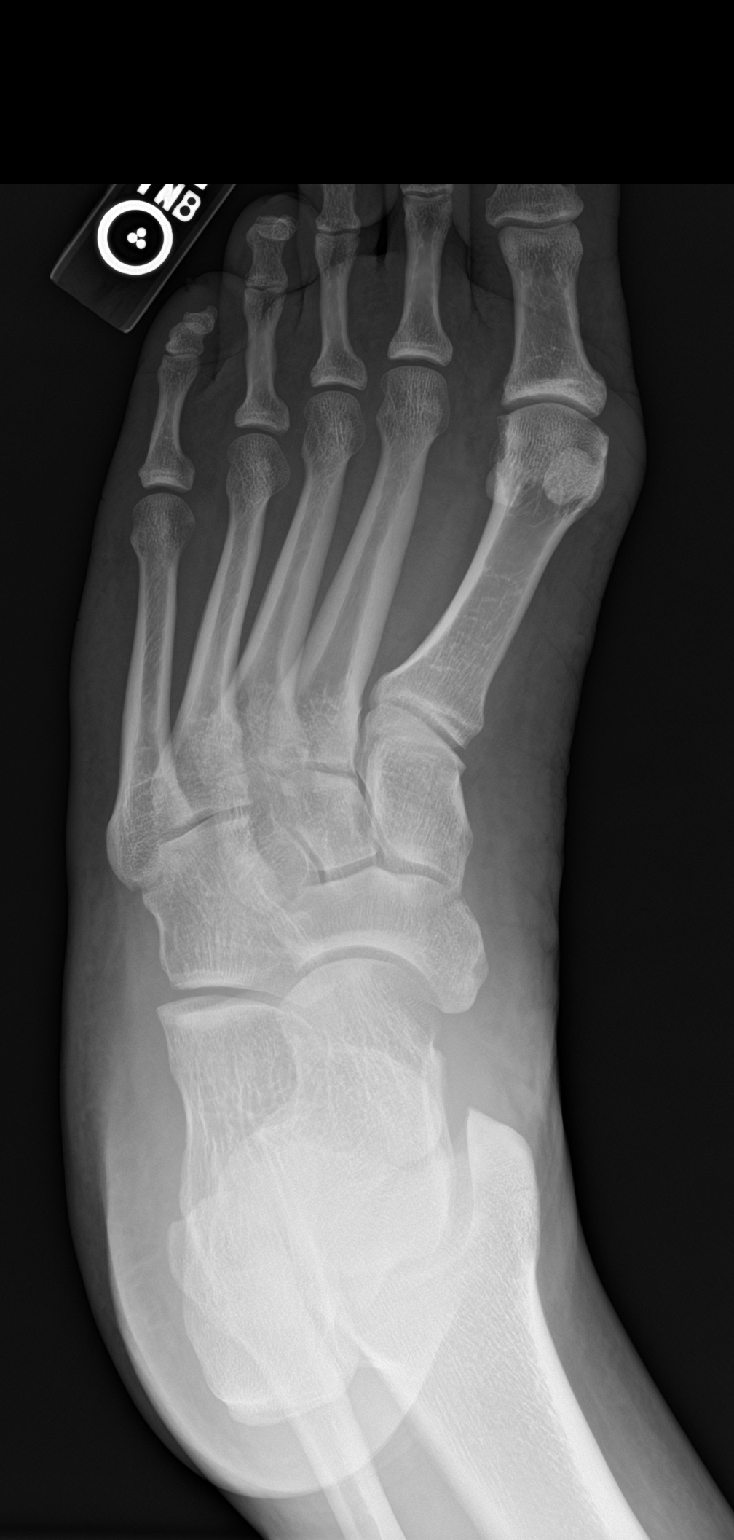

[foot obl]
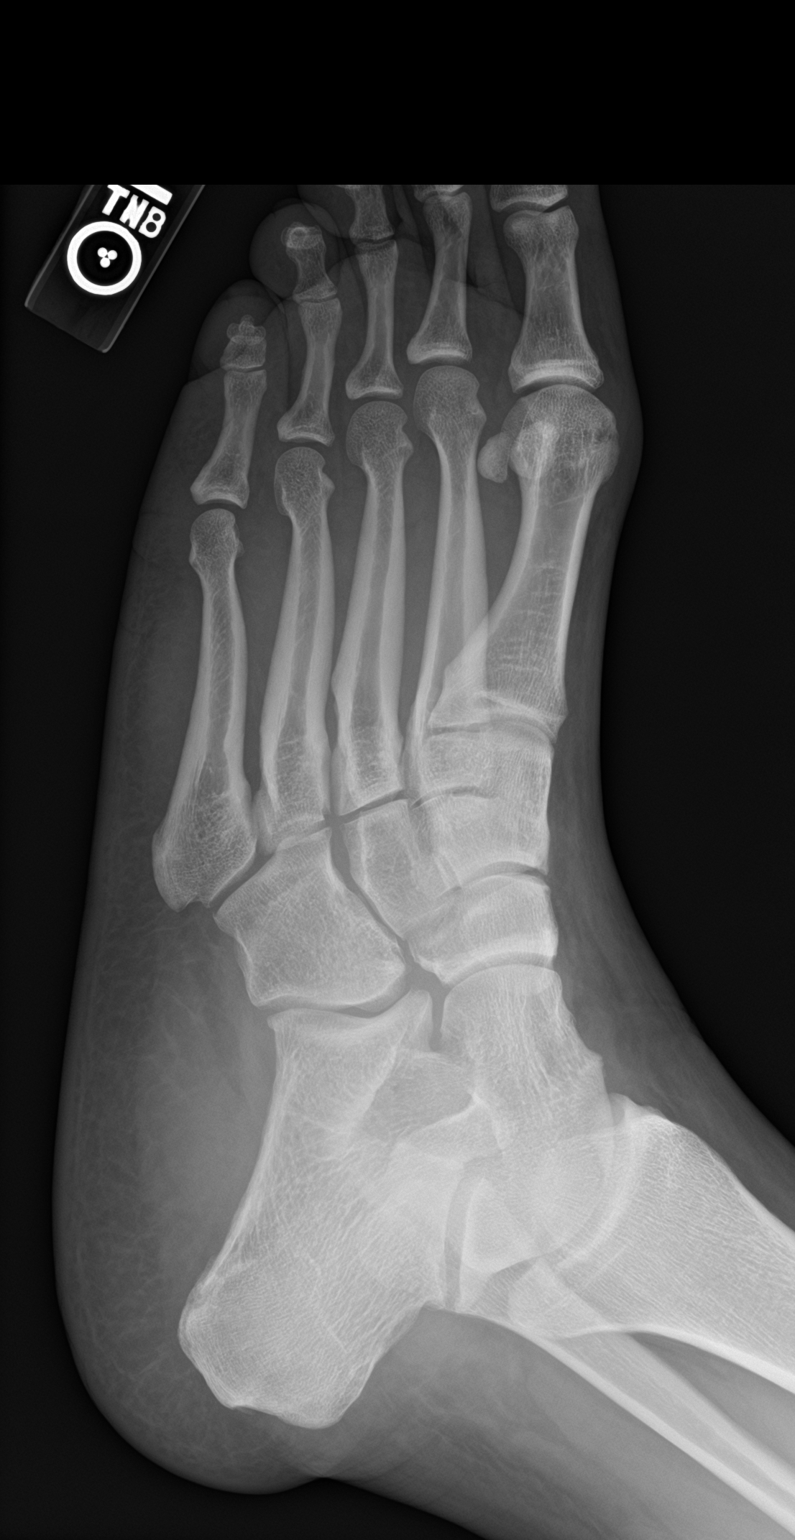

[foot lat]
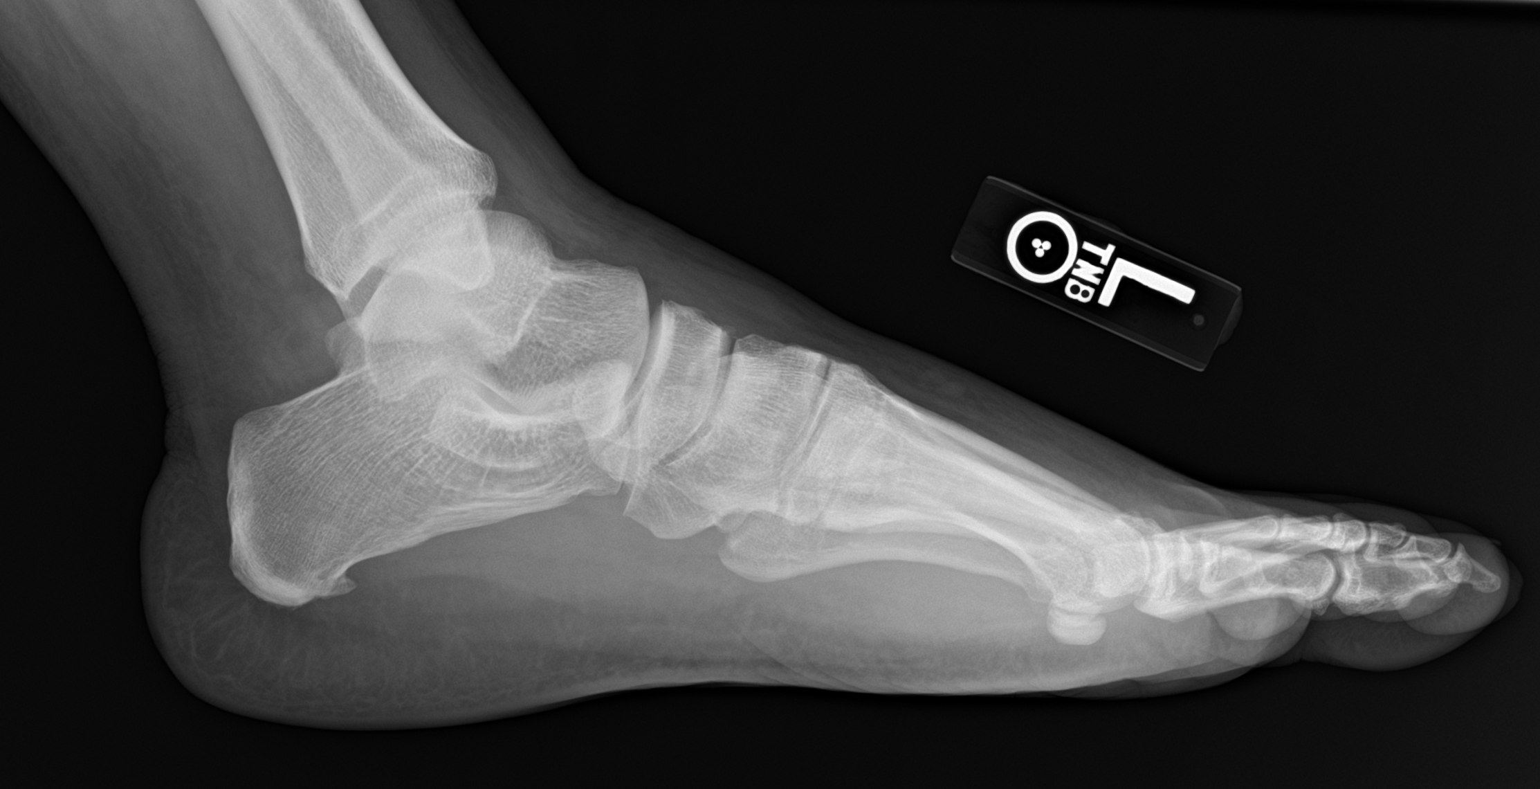

[3 of 3 positions shown; findings below may reference images not displayed]

FINDINGS: There is no evidence of fracture or dislocation. There is no
evidence of arthropathy or other focal bone abnormality. Soft
tissues are unremarkable.
IMPRESSION: Negative.

## 2020-03-08 NOTE — Progress Notes (Signed)
I,Yamilka Roman [employer] as a Education administrator for [employer], FNP.,have documented all relevant documentation on the behalf of [name], FNP,as directed by  [name], FNP while in the presence of [name], [place]. This visit occurred during the SARS-CoV-2 public health emergency.  Safety protocols were in place, including screening questions prior to the visit, additional usage of staff PPE, and extensive cleaning of exam room while observing appropriate contact time as indicated for disinfecting solutions.  Subjective:     Patient ID: Steven Leon , male    DOB: 12/30/1980 , 39 y.o.   MRN: [id number]   Chief Complaint  Patient presents with  . Health Maintenance    HPI  Here for health maintenance  Wt Readings from Last 3 Encounters: 03/09/20 : 238 lb 6.4 oz (108.1 kg) 01/25/20 : 241 lb 6.4 oz (109.5 kg) 01/13/20 : (!) 240 lb (108.9 kg)     Past Medical History:  Diagnosis Date  . Essential hypertension 04/04/2016  . Gout   . Hypertension      Family History  Problem Relation Age of Onset  . Diabetes Mother   . Seizures Father   . Cancer Maternal Grandmother   . Diabetes Maternal Grandmother   . Cancer - Prostate Maternal Grandfather   . Diabetes Paternal Grandmother   . Hypertension Paternal Grandmother   . Diabetes Maternal Aunt   . Diabetes Maternal Uncle      Current Outpatient Medications:  .  allopurinol (ZYLOPRIM) 100 MG tablet, Take 1 tablet (100 mg total) by mouth daily., Disp: 30 tablet, Rfl: 2 .  cetirizine (ZYRTEC) 10 MG tablet, Take 10 mg by mouth daily as needed for allergies., Disp: , Rfl:  .  colchicine 0.6 MG tablet, Take 1 tablet by mouth as needed BID, Disp: 30 tablet, Rfl: 0 .  losartan (COZAAR) 50 MG tablet, Take 1 tablet (50 mg total) by mouth daily., Disp: 30 tablet, Rfl: 2 .  Vitamin D, Ergocalciferol, (DRISDOL) 1.25 MG (50000 UNIT) CAPS capsule, Take 1 capsule (50,000 Units total) by mouth 2 (two) times a week. (Patient not taking:  Reported on 03/08/2020), Disp: 24 capsule, Rfl: 1   No Known Allergies   Men's preventive visit. Patient Health Questionnaire (PHQ-2) is    Office Visit from 08/31/2019 in [facility]  PHQ-2 Total Score 0     Patient is on a regular diet cut back on salt. Not currently exercising, he did walk [place] Saturday afternoon but nothing regular.  Marital status: Married. Relevant history for alcohol use is:   Social History   Substance and Sexual Activity  Alcohol Use Yes  . Alcohol/week: 1.0 standard drink  . Types: 1 Cans of beer per week   Relevant history for tobacco use is:  Social History   Tobacco Use  Smoking Status Never Smoker  Smokeless Tobacco Never Used  .   Review of Systems  Constitutional: Negative.   HENT: Negative.   Eyes: Negative.   Respiratory: Negative.   Cardiovascular: Negative.   Gastrointestinal: Negative.   Endocrine: Negative.   Genitourinary: Negative.   Musculoskeletal: Negative.   Skin: Negative.   Neurological: Negative.   Hematological: Negative.   Psychiatric/Behavioral: Negative.      Today's Vitals   03/09/20 0850  BP: 122/86  Pulse: 70  Temp: 98 F (36.7 C)  TempSrc: Oral  Weight: 238 lb 6.4 oz (108.1 kg)  Height: 5' 7.2" (1.707 m)  PainSc: 3    Body mass index is  37.12 kg/m.   Objective:  Physical Exam Vitals reviewed.  Constitutional:      General: He is not in acute distress.    Appearance: Normal appearance. He is obese.  HENT:     Head: Normocephalic and atraumatic.     Right Ear: Tympanic membrane, ear canal and external ear normal. There is no impacted cerumen.     Left Ear: Tympanic membrane, ear canal and external ear normal. There is no impacted cerumen.     Nose:     Comments: Deferred - masked     Mouth/Throat:     Comments: Deferred - masked  Cardiovascular:     Rate and Rhythm: Normal rate and regular rhythm.     Pulses: Normal pulses.     Heart sounds: Normal heart sounds.  No murmur heard.   Pulmonary:     Effort: Pulmonary effort is normal. No respiratory distress.     Breath sounds: Normal breath sounds.  Abdominal:     General: Abdomen is flat. Bowel sounds are normal. There is no distension.     Palpations: Abdomen is soft.  Genitourinary:    Prostate: Normal.     Rectum: Guaiac result negative.  Musculoskeletal:        General: Normal range of motion.     Cervical back: Normal range of motion and neck supple.  Skin:    General: Skin is warm.     Capillary Refill: Capillary refill takes less than 2 seconds.  Neurological:     General: No focal deficit present.     Mental Status: He is alert and oriented to person, place, and time.  Psychiatric:        Mood and Affect: Mood normal.        Behavior: Behavior normal.        Thought Content: Thought content normal.        Judgment: Judgment normal.         Assessment And Plan:    1. Encounter for general adult medical examination w/o abnormal findings . Behavior modifications discussed and diet history reviewed.   . Pt will continue to exercise regularly and modify diet with low GI, plant based foods and decrease intake of processed foods.  . Recommend intake of daily multivitamin, Vitamin D, and calcium.  . Recommend for preventive screenings, as well as recommend immunizations that include influenza, TDAP (up to date)  2. Essential hypertension . B/P is controlled. He would like to start weaning off his blood pressure medications but at this time I don't think it is best . CMP ordered to check renal function.  . The importance of regular exercise and dietary modification was stressed to the patient.  . Stressed importance of losing ten percent of her body weight to help with B/P control.  . 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 Abnormal glucose  Chronic, stable  No  current medications  Continue with regular exercise. - POCT Urinalysis Dipstick (81002) - POCT UA - Microalbumin - Hemoglobin A1c  4. Vitamin D deficiency  Will check vitamin D level and supplement as needed.     Also encouraged to spend 15 minutes in the sun daily.  - Vitamin D (25 hydroxy)  5. Encounter for prostate cancer screening  Paternal grandfather had prostate cancer  6. Localized swelling of right lower extremity  He does have  swelling to his lower extremity, good pulses  Will check a venous doppler, I think it is least likely for him to have a clot but would like to see if he has venous insufficiency  Encouraged to wear support socks.  - VAS [place] LOWER EXTREMITY VENOUS (DVT); Future  7. Stress  He is having challenges with coping with stress  Will refer for counseling - Ambulatory referral to Psychology  8. Obesity (BMI 30-39.9) Chronic, he has lost 3 lbs since his last office visit Discussed healthy diet and regular exercise options  Encouraged to exercise at least 150 minutes per week with 2 days of strength training . Encouraged to park further when at the store, take stairs instead of elevators and to walk in place during commercials. . Increase water intake to at least one gallon of water daily.     Patient was given opportunity to ask questions. Patient verbalized understanding of the plan and was able to repeat key elements of the plan. All questions were answered to their satisfaction.   Teola Bradley, FNP, have reviewed all documentation for this visit. The documentation on 03/09/20 for the exam, diagnosis, procedures, and orders are all accurate and complete.  THE PATIENT IS ENCOURAGED TO PRACTICE SOCIAL DISTANCING DUE TO THE COVID-19 PANDEMIC.

## 2020-03-08 NOTE — Patient Instructions (Addendum)

## 2020-03-09 ENCOUNTER — Other Ambulatory Visit: Payer: Self-pay

## 2020-03-09 ENCOUNTER — Encounter: Payer: Self-pay | Admitting: Nurse Practitioner

## 2020-03-09 ENCOUNTER — Ambulatory Visit: Payer: 59 | Admitting: Nurse Practitioner

## 2020-03-09 VITALS — BP 122/86 | HR 70 | Temp 98.0°F | Ht 67.2 in | Wt 238.4 lb

## 2020-03-09 DIAGNOSIS — R7309 Other abnormal glucose: Secondary | ICD-10-CM

## 2020-03-09 DIAGNOSIS — I10 Essential (primary) hypertension: Secondary | ICD-10-CM | POA: Diagnosis not present

## 2020-03-09 DIAGNOSIS — E669 Obesity, unspecified: Secondary | ICD-10-CM

## 2020-03-09 DIAGNOSIS — Z0000 Encounter for general adult medical examination without abnormal findings: Secondary | ICD-10-CM | POA: Diagnosis not present

## 2020-03-09 DIAGNOSIS — R2241 Localized swelling, mass and lump, right lower limb: Secondary | ICD-10-CM

## 2020-03-09 DIAGNOSIS — Z125 Encounter for screening for malignant neoplasm of prostate: Secondary | ICD-10-CM

## 2020-03-09 DIAGNOSIS — E559 Vitamin D deficiency, unspecified: Secondary | ICD-10-CM | POA: Diagnosis not present

## 2020-03-09 DIAGNOSIS — F439 Reaction to severe stress, unspecified: Secondary | ICD-10-CM

## 2020-03-13 ENCOUNTER — Ambulatory Visit (HOSPITAL_COMMUNITY)
Admission: RE | Admit: 2020-03-13 | Discharge: 2020-03-13 | Disposition: A | Discharge status: Home / Self Care | Payer: 59 | Source: Ambulatory Visit | Attending: Internal Medicine | Admitting: Internal Medicine

## 2020-03-13 ENCOUNTER — Other Ambulatory Visit: Payer: Self-pay

## 2020-03-13 DIAGNOSIS — R2241 Localized swelling, mass and lump, right lower limb: Secondary | ICD-10-CM | POA: Diagnosis present

## 2020-03-19 ENCOUNTER — Other Ambulatory Visit: Payer: Self-pay | Admitting: Nurse Practitioner

## 2020-03-19 DIAGNOSIS — M1049 Other secondary gout, multiple sites: Secondary | ICD-10-CM

## 2020-04-04 ENCOUNTER — Telehealth: Payer: Self-pay

## 2020-04-04 NOTE — Telephone Encounter (Signed)
LVM for pt to stop by to have lab work done that was missed at his last appt

## 2020-04-04 NOTE — Telephone Encounter (Signed)
-----   Message from Arnette Felts, FNP sent at 04/03/2020 11:06 PM EDT ----- Looks like he did not stop for labs see if he is willing to come back for his labs ----- Message ----- From: SYSTEM Sent: 03/14/2020  12:08 AM EDT To: Arnette Felts, FNP

## 2020-04-16 ENCOUNTER — Other Ambulatory Visit: Payer: Self-pay | Admitting: Nurse Practitioner

## 2020-04-16 DIAGNOSIS — I10 Essential (primary) hypertension: Secondary | ICD-10-CM

## 2020-05-05 ENCOUNTER — Other Ambulatory Visit: Payer: Self-pay | Admitting: Internal Medicine

## 2020-05-05 DIAGNOSIS — M25569 Pain in unspecified knee: Secondary | ICD-10-CM

## 2020-05-06 MED ORDER — COLCHICINE 0.6 MG PO TABS: ORAL_TABLET | ORAL | 0 refills | Status: DC

## 2020-07-12 ENCOUNTER — Ambulatory Visit: Payer: 59 | Admitting: Nurse Practitioner

## 2020-08-01 ENCOUNTER — Encounter: Payer: Self-pay | Admitting: Nurse Practitioner

## 2020-08-01 ENCOUNTER — Other Ambulatory Visit: Payer: Self-pay

## 2020-08-01 DIAGNOSIS — I10 Essential (primary) hypertension: Secondary | ICD-10-CM

## 2020-08-01 DIAGNOSIS — M25569 Pain in unspecified knee: Secondary | ICD-10-CM

## 2020-08-01 MED ORDER — COLCHICINE 0.6 MG PO TABS: ORAL_TABLET | ORAL | 2 refills | Status: DC

## 2020-08-01 MED ORDER — LOSARTAN POTASSIUM 50 MG PO TABS: ORAL_TABLET | ORAL | 2 refills | Status: DC

## 2020-08-08 ENCOUNTER — Encounter: Payer: Self-pay | Admitting: Nurse Practitioner

## 2020-08-08 ENCOUNTER — Other Ambulatory Visit: Payer: Self-pay

## 2020-08-08 ENCOUNTER — Ambulatory Visit: Payer: 59 | Admitting: Nurse Practitioner

## 2020-08-08 VITALS — BP 150/90 | HR 74 | Temp 97.6°F | Ht 66.4 in | Wt 247.4 lb

## 2020-08-08 DIAGNOSIS — I10 Essential (primary) hypertension: Secondary | ICD-10-CM | POA: Diagnosis not present

## 2020-08-08 DIAGNOSIS — R7309 Other abnormal glucose: Secondary | ICD-10-CM

## 2020-08-08 DIAGNOSIS — E559 Vitamin D deficiency, unspecified: Secondary | ICD-10-CM

## 2020-08-08 MED ORDER — MAGNESIUM 250 MG PO TABS: 1.0000 | ORAL_TABLET | Freq: Every day | ORAL | 0 refills | Status: DC

## 2020-08-08 MED ORDER — LOSARTAN POTASSIUM 50 MG PO TABS: ORAL_TABLET | ORAL | 1 refills | Status: DC

## 2020-08-08 NOTE — Patient Instructions (Signed)

## 2020-08-08 NOTE — Progress Notes (Signed)
I,Yamilka Roman [employer] as a Neurosurgeon for [employer], FNP.,have documented all relevant documentation on the behalf of Arnette Felts, FNP,as directed by  Arnette Felts, FNP while in the presence of Arnette Felts, FNP. This visit occurred during the SARS-CoV-2 public health emergency.  Safety protocols were in place, including screening questions prior to the visit, additional usage of staff PPE, and extensive cleaning of exam room while observing appropriate contact time as indicated for disinfecting solutions.  Subjective:     Patient ID: Steven Leon , male    DOB: 1980-11-27 , 40 y.o.   MRN: [id number]   Chief Complaint  Patient presents with  . Hypertension  . abnormal glucose f/u  . vitamin d recheck    HPI  Patient presents today for a blood pressure, abnormal glucose and vitamin d f/u. He has been without his blood pressure medications for the last 3 weeks. He is working a lot and no plans to have help.  He has cut back on red meat. When he eats [place] food he notices a flare.   Wt Readings from Last 3 Encounters: 08/08/20 : 247 lb 6.4 oz (112.2 kg) 03/09/20 : 238 lb 6.4 oz (108.1 kg) 01/25/20 : 241 lb 6.4 oz (109.5 kg)  Hypertension This is a chronic problem. The current episode started more than 1 year ago. The problem is unchanged. The problem is controlled. Pertinent negatives include no anxiety, chest pain, headaches or palpitations. Risk factors for coronary artery disease include sedentary lifestyle. Compliance problems: medication adherence.      Past Medical History:  Diagnosis Date  . Essential hypertension 04/04/2016  . Gout   . Hypertension      Family History  Problem Relation Age of Onset  . Diabetes Mother   . Seizures Father   . Cancer Maternal Grandmother   . Diabetes Maternal Grandmother   . Cancer - Prostate Maternal Grandfather   . Diabetes Paternal Grandmother   . Hypertension Paternal Grandmother   . Diabetes Maternal Aunt   . Diabetes  Maternal Uncle      Current Outpatient Medications:  .  cetirizine (ZYRTEC) 10 MG tablet, Take 10 mg by mouth daily as needed for allergies., Disp: , Rfl:  .  colchicine 0.6 MG tablet, Take 1 tablet by mouth as needed BID, Disp: 30 tablet, Rfl: 2 .  Magnesium 250 MG TABS, Take 1 tablet (250 mg total) by mouth daily. With evening meal, Disp: 90 tablet, Rfl: 0 .  losartan (COZAAR) 50 MG tablet, TAKE 1 TABLET(50 MG) BY MOUTH DAILY, Disp: 90 tablet, Rfl: 1 .  Vitamin D, Ergocalciferol, (DRISDOL) 1.25 MG (50000 UNIT) CAPS capsule, Take 1 capsule (50,000 Units total) by mouth 2 (two) times a week. (Patient not taking: No sig reported), Disp: 24 capsule, Rfl: 1   No Known Allergies   Review of Systems  Constitutional: Negative.   HENT: Negative.   Eyes: Negative.   Respiratory: Negative.   Cardiovascular: Negative.  Negative for chest pain, palpitations and leg swelling.  Gastrointestinal: Negative.   Endocrine: Negative.   Genitourinary: Negative.   Musculoskeletal: Negative.   Skin: Negative.   Neurological: Negative.  Negative for headaches.  Hematological: Negative.   Psychiatric/Behavioral: Negative.      Today's Vitals   08/08/20 1508  BP: (!) 150/90  Pulse: 74  Temp: 97.6 F (36.4 C)  TempSrc: Oral  Weight: 247 lb 6.4 oz (112.2 kg)  Height: 5' 6.4" (1.687 m)  PainSc: 0-No pain   Body mass index  is 39.45 kg/m.   Objective:  Physical Exam Constitutional:      General: He is not in acute distress.    Appearance: Normal appearance. He is obese.  Cardiovascular:     Rate and Rhythm: Normal rate and regular rhythm.     Pulses: Normal pulses.     Heart sounds: Normal heart sounds. No murmur heard.   Pulmonary:     Effort: Pulmonary effort is normal. No respiratory distress.     Breath sounds: Normal breath sounds. No wheezing.  Musculoskeletal:     Cervical back: Normal range of motion and neck supple.  Skin:    General: Skin is warm and dry.     Capillary  Refill: Capillary refill takes less than 2 seconds.  Neurological:     General: No focal deficit present.     Mental Status: He is alert and oriented to person, place, and time.     Cranial Nerves: No cranial nerve deficit.     Motor: No weakness.  Psychiatric:        Mood and Affect: Mood normal.        Behavior: Behavior normal.        Thought Content: Thought content normal.        Judgment: Judgment normal.         Assessment And Plan:     1. Essential hypertension  Chronic, elevated this visit  Discussed how stress and not getting adequate sleep can affect blood pressure  Also encouraged to do self care  Encouraged to take magnesium with evening meal - losartan (COZAAR) 50 MG tablet; TAKE 1 TABLET(50 MG) BY MOUTH DAILY  Dispense: 90 tablet; Refill: 1 - Magnesium 250 MG TABS; Take 1 tablet (250 mg total) by mouth daily. With evening meal  Dispense: 90 tablet; Refill: 0  2. Abnormal glucose  Chronic, stable  No current medications  Encouraged to limit intake of sugary foods and drinks   Encouraged to increase physical activity to 150 minutes per week  No labs this visit  3. Vitamin D deficiency  supplement Vitamin d as needed.     Also encouraged to spend 15 minutes in the sun daily.     Patient was given opportunity to ask questions. Patient verbalized understanding of the plan and was able to repeat key elements of the plan. All questions were answered to their satisfaction.  Arnette Felts, FNP   I, Arnette Felts, FNP, have reviewed all documentation for this visit. The documentation on 08/08/20 for the exam, diagnosis, procedures, and orders are all accurate and complete.   THE PATIENT IS ENCOURAGED TO PRACTICE SOCIAL DISTANCING DUE TO THE COVID-19 PANDEMIC.

## 2020-11-07 ENCOUNTER — Ambulatory Visit: Payer: 59 | Admitting: Nurse Practitioner

## 2021-01-18 ENCOUNTER — Other Ambulatory Visit: Payer: Self-pay | Admitting: Nurse Practitioner

## 2021-01-18 DIAGNOSIS — I10 Essential (primary) hypertension: Secondary | ICD-10-CM

## 2021-01-18 MED ORDER — LOSARTAN POTASSIUM 50 MG PO TABS: ORAL_TABLET | ORAL | 0 refills | Status: DC

## 2021-03-15 ENCOUNTER — Encounter: Payer: Self-pay | Admitting: Nurse Practitioner

## 2021-03-15 ENCOUNTER — Other Ambulatory Visit: Payer: Self-pay

## 2021-03-15 ENCOUNTER — Ambulatory Visit (INDEPENDENT_AMBULATORY_CARE_PROVIDER_SITE_OTHER): Payer: 59 | Admitting: Nurse Practitioner

## 2021-03-15 VITALS — BP 136/74 | HR 85 | Temp 98.6°F | Ht 66.8 in | Wt 245.6 lb

## 2021-03-15 DIAGNOSIS — Z0000 Encounter for general adult medical examination without abnormal findings: Secondary | ICD-10-CM | POA: Diagnosis not present

## 2021-03-15 DIAGNOSIS — Z79899 Other long term (current) drug therapy: Secondary | ICD-10-CM

## 2021-03-15 DIAGNOSIS — Z2821 Immunization not carried out because of patient refusal: Secondary | ICD-10-CM

## 2021-03-15 DIAGNOSIS — I10 Essential (primary) hypertension: Secondary | ICD-10-CM | POA: Diagnosis not present

## 2021-03-15 DIAGNOSIS — R7309 Other abnormal glucose: Secondary | ICD-10-CM | POA: Diagnosis not present

## 2021-03-15 DIAGNOSIS — E559 Vitamin D deficiency, unspecified: Secondary | ICD-10-CM | POA: Diagnosis not present

## 2021-03-15 DIAGNOSIS — Z125 Encounter for screening for malignant neoplasm of prostate: Secondary | ICD-10-CM

## 2021-03-15 DIAGNOSIS — Z6838 Body mass index (BMI) 38.0-38.9, adult: Secondary | ICD-10-CM

## 2021-03-15 DIAGNOSIS — M25569 Pain in unspecified knee: Secondary | ICD-10-CM

## 2021-03-15 DIAGNOSIS — E66812 Obesity, class 2: Secondary | ICD-10-CM

## 2021-03-15 DIAGNOSIS — M1049 Other secondary gout, multiple sites: Secondary | ICD-10-CM

## 2021-03-15 LAB — POCT URINALYSIS DIPSTICK
Bilirubin, UA: NEGATIVE
Blood, UA: NEGATIVE
Glucose, UA: NEGATIVE
Ketones, UA: NEGATIVE
Leukocytes, UA: NEGATIVE
Nitrite, UA: NEGATIVE
Protein, UA: NEGATIVE
Spec Grav, UA: 1.01 (ref 1.010–1.025)
Urobilinogen, UA: 0.2 E.U./dL
pH, UA: 5.5 (ref 5.0–8.0)

## 2021-03-15 LAB — POCT UA - MICROALBUMIN
Albumin/Creatinine Ratio, Urine, POC: 30
Creatinine, POC: 200 mg/dL
Microalbumin Ur, POC: 10 mg/L

## 2021-03-15 MED ORDER — LOSARTAN POTASSIUM 50 MG PO TABS: ORAL_TABLET | ORAL | 1 refills | Status: DC

## 2021-03-15 MED ORDER — COLCHICINE 0.6 MG PO TABS: ORAL_TABLET | ORAL | 2 refills | Status: DC

## 2021-03-15 NOTE — Progress Notes (Signed)
I,Steven Leon,acting as a Education administrator for [employer], FNP.,have documented all relevant documentation on the behalf of Steven Brine, FNP,as directed by  Steven Brine, FNP while in the presence of Steven Leon, [name].  This visit occurred during the SARS-CoV-2 public health emergency.  Safety protocols were in place, including screening questions prior to the visit, additional usage of staff PPE, and extensive cleaning of exam room while observing appropriate contact time as indicated for disinfecting solutions.  Subjective:     Patient ID: Steven Leon , male    DOB: [birth year] , 40 y.o.   MRN: [id number]   Chief Complaint  Patient presents with   Annual Exam    HPI  Here for health maintenance  Wt Readings from Last 3 Encounters: 03/15/21 : 245 lb 9.6 oz (111.4 kg) 08/08/20 : 247 lb 6.4 oz (112.2 kg) 03/09/20 : 238 lb 6.4 oz (108.1 kg)      Past Medical History:  Diagnosis Date   Essential hypertension 04/04/2016   Gout    Hypertension      Family History  Problem Relation Age of Onset   Diabetes Mother    Seizures Father    Cancer Maternal Grandmother    Diabetes Maternal Grandmother    Cancer - Prostate Maternal Grandfather    Diabetes Paternal Grandmother    Hypertension Paternal Grandmother    Diabetes Maternal Aunt    Diabetes Maternal Uncle      Current Outpatient Medications:    cetirizine (ZYRTEC) 10 MG tablet, Take 10 mg by mouth daily as needed for allergies., Disp: , Rfl:    colchicine 0.6 MG tablet, Take 1 tablet by mouth as needed BID, Disp: 30 tablet, Rfl: 2   losartan (COZAAR) 50 MG tablet, TAKE 1 TABLET(50 MG) BY MOUTH DAILY, Disp: 90 tablet, Rfl: 1   Magnesium 250 MG TABS, Take 1 tablet (250 mg total) by mouth daily. With evening meal, Disp: 90 tablet, Rfl: 0   No Known Allergies   Men's preventive visit. Patient Health Questionnaire (PHQ-2) is  [place] Visit from 03/15/2021 in [facility]  PHQ-2 Total Score  0      Patient is on a Regular diet, drinking more water and avoiding fried food. Exercising daily with walking 4 miles a day. Gym 2-3 times a week. Marital status: Married. Relevant history for alcohol use is:  Social History   Substance and Sexual Activity  Alcohol Use Yes   Alcohol/week: 1.0 standard drink   Types: 1 Cans of beer per week  . Relevant history for tobacco use is:  Social History   Tobacco Use  Smoking Status Never  Smokeless Tobacco Never  .   Review of Systems  Constitutional: Negative.   HENT: Negative.    Eyes: Negative.   Respiratory: Negative.    Cardiovascular: Negative.   Gastrointestinal: Negative.   Endocrine: Negative.   Genitourinary: Negative.   Musculoskeletal: Negative.   Skin: Negative.   Allergic/Immunologic: Negative.   Neurological: Negative.   Hematological: Negative.   Psychiatric/Behavioral: Negative.      Today's Vitals   03/15/21 0922  BP: 136/74  Pulse: 85  Temp: 98.6 F (37 C)  TempSrc: Oral  Weight: 245 lb 9.6 oz (111.4 kg)  Height: 5' 6.8" (1.697 m)   Body mass index is 38.7 kg/m.   Objective:  Physical Exam Vitals reviewed.  Constitutional:      General: He is not in acute distress.    Appearance: Normal appearance. He is  obese.  HENT:     Head: Normocephalic and atraumatic.     Right Ear: Tympanic membrane, ear canal and external ear normal. There is no impacted cerumen.     Left Ear: Tympanic membrane, ear canal and external ear normal. There is no impacted cerumen.     Nose:     Comments: Deferred - masked     Mouth/Throat:     Comments: Deferred - masked  Cardiovascular:     Rate and Rhythm: Normal rate and regular rhythm.     Pulses: Normal pulses.     Heart sounds: Normal heart sounds. No murmur heard. Pulmonary:     Effort: Pulmonary effort is normal. No respiratory distress.     Breath sounds: Normal breath sounds. No wheezing.  Abdominal:     General: Abdomen is flat. Bowel sounds are normal.  There is no distension.     Palpations: Abdomen is soft.  Genitourinary:    Prostate: Normal.     Rectum: Guaiac result negative.  Musculoskeletal:        General: No swelling. Normal range of motion.     Cervical back: Normal range of motion and neck supple.  Skin:    General: Skin is warm.     Capillary Refill: Capillary refill takes less than 2 seconds.  Neurological:     General: No focal deficit present.     Mental Status: He is alert and oriented to person, place, and time.  Psychiatric:        Mood and Affect: Mood normal.        Behavior: Behavior normal.        Thought Content: Thought content normal.        Judgment: Judgment normal.        Assessment And Plan:    1. Encounter for general adult medical examination w/o abnormal findings Behavior modifications discussed and diet history reviewed.   Pt will continue to exercise regularly and modify diet with low GI, plant based foods and decrease intake of processed foods.  Recommend intake of daily multivitamin, Vitamin D, and calcium.  Recommend for preventive screenings, as well as recommend immunizations that include influenza, TDAP - Lipid panel  2. Essential hypertension Chronic, fair control Continue current medications  - POCT Urinalysis Dipstick (81002) - POCT UA - Microalbumin - EKG 12-Lead - losartan (COZAAR) 50 MG tablet; TAKE 1 TABLET(50 MG) BY MOUTH DAILY  Dispense: 90 tablet; Refill: 1  3. Abnormal glucose No current medications, diet controlled - Hemoglobin A1c - CMP14+EGFR  4. Influenza vaccination declined Patient declined influenza vaccination at this time. Patient is aware that influenza vaccine prevents illness in 70% of healthy people, and reduces hospitalizations to 30-70% in elderly. This vaccine is recommended annually. Pt is willing to accept risk associated with refusing vaccination.  5. Vitamin D deficiency Will check vitamin D level and supplement as needed.    Also encouraged to  spend 15 minutes in the sun daily.  - VITAMIN D 25 Hydroxy (Vit-D Deficiency, Fractures)  6. Class 2 severe obesity due to excess calories with serious comorbidity and body mass index (BMI) of 38.0 to 38.9 in adult [facility]) Chronic Discussed healthy diet and regular exercise options  Encouraged to exercise at least 150 minutes per week with 2 days of strength training  7. Encounter for prostate cancer screening Manual prostate exam normal, negative hemocult - PSA  8. Other long term (current) drug therapy - CBC  9. Other secondary acute gout of  multiple sites Minimal exacerbations - Uric acid  10. Acute knee pain, unspecified laterality Negative for current knee pain, but would like to have medication on hand will be going out of town in few weeks - colchicine 0.6 MG tablet; Take 1 tablet by mouth as needed BID  Dispense: 30 tablet; Refill: 2     Patient was given opportunity to ask questions. Patient verbalized understanding of the plan and was able to repeat key elements of the plan. All questions were answered to their satisfaction.   Steven Brine, FNP   I, Steven Brine, FNP, have reviewed all documentation for this visit. The documentation on 03/15/21 for the exam, diagnosis, procedures, and orders are all accurate and complete.   THE PATIENT IS ENCOURAGED TO PRACTICE SOCIAL DISTANCING DUE TO THE COVID-19 PANDEMIC.

## 2021-03-15 NOTE — Patient Instructions (Signed)
Health Maintenance, Male Adopting a healthy lifestyle and getting preventive care are important in promoting health and wellness. Ask your health care provider about: The right schedule for you to have regular tests and exams. Things you can do on your own to prevent diseases and keep yourself healthy. What should I know about diet, weight, and exercise? Eat a healthy diet  Eat a diet that includes plenty of vegetables, fruits, low-fat dairy products, and lean protein. Do not eat a lot of foods that are high in solid fats, added sugars, or sodium. Maintain a healthy weight Body mass index (BMI) is a measurement that can be used to identify possible weight problems. It estimates body fat based on height and weight. Your health care provider can help determine your BMI and help you achieve or maintain a healthy weight. Get regular exercise Get regular exercise. This is one of the most important things you can do for your health. Most adults should: Exercise for at least 150 minutes each week. The exercise should increase your heart rate and make you sweat (moderate-intensity exercise). Do strengthening exercises at least twice a week. This is in addition to the moderate-intensity exercise. Spend less time sitting. Even light physical activity can be beneficial. Watch cholesterol and blood lipids Have your blood tested for lipids and cholesterol at 41 years of age, then have this test every 5 years. You may need to have your cholesterol levels checked more often if: Your lipid or cholesterol levels are high. You are older than 40 years of age. You are at high risk for heart disease. What should I know about cancer screening? Many types of cancers can be detected early and may often be prevented. Depending on your health history and family history, you may need to have cancer screening at various ages. This may include screening for: Colorectal cancer. Prostate cancer. Skin cancer. Lung  cancer. What should I know about heart disease, diabetes, and high blood pressure? Blood pressure and heart disease High blood pressure causes heart disease and increases the risk of stroke. This is more likely to develop in people who have high blood pressure readings, are of African descent, or are overweight. Talk with your health care provider about your target blood pressure readings. Have your blood pressure checked: Every 3-5 years if you are 18-39 years of age. Every year if you are 40 years old or older. If you are between the ages of 65 and 75 and are a current or former smoker, ask your health care provider if you should have a one-time screening for abdominal aortic aneurysm (AAA). Diabetes Have regular diabetes screenings. This checks your fasting blood sugar level. Have the screening done: Once every three years after age 45 if you are at a normal weight and have a low risk for diabetes. More often and at a younger age if you are overweight or have a high risk for diabetes. What should I know about preventing infection? Hepatitis B If you have a higher risk for hepatitis B, you should be screened for this virus. Talk with your health care provider to find out if you are at risk for hepatitis B infection. Hepatitis C Blood testing is recommended for: Everyone born from 1945 through 1965. Anyone with known risk factors for hepatitis C. Sexually transmitted infections (STIs) You should be screened each year for STIs, including gonorrhea and chlamydia, if: You are sexually active and are younger than 41 years of age. You are older than 41 years   of age and your health care provider tells you that you are at risk for this type of infection. Your sexual activity has changed since you were last screened, and you are at increased risk for chlamydia or gonorrhea. Ask your health care provider if you are at risk. Ask your health care provider about whether you are at high risk for HIV.  Your health care provider may recommend a prescription medicine to help prevent HIV infection. If you choose to take medicine to prevent HIV, you should first get tested for HIV. You should then be tested every 3 months for as long as you are taking the medicine. Follow these instructions at home: Lifestyle Do not use any products that contain nicotine or tobacco, such as cigarettes, e-cigarettes, and chewing tobacco. If you need help quitting, ask your health care provider. Do not use street drugs. Do not share needles. Ask your health care provider for help if you need support or information about quitting drugs. Alcohol use Do not drink alcohol if your health care provider tells you not to drink. If you drink alcohol: Limit how much you have to 0-2 drinks a day. Be aware of how much alcohol is in your drink. In the [place] one drink equals one 12 oz bottle of beer (355 mL), one 5 oz glass of wine (148 mL), or one 1 oz glass of hard liquor (44 mL). General instructions Schedule regular health, dental, and eye exams. Stay current with your vaccines. Tell your health care provider if: You often feel depressed. You have ever been abused or do not feel safe at home. Summary Adopting a healthy lifestyle and getting preventive care are important in promoting health and wellness. Follow your health care provider's instructions about healthy diet, exercising, and getting tested or screened for diseases. Follow your health care provider's instructions on monitoring your cholesterol and blood pressure. This information is not intended to replace advice given to you by your health care provider. Make sure you discuss any questions you have with your health care provider. Document Revised: 08/18/2020 Document Reviewed: 06/03/2018 Elsevier Patient Education  2022 Elsevier Inc.  

## 2021-03-16 LAB — CMP14+EGFR
ALT: 27 IU/L (ref 0–44)
AST: 27 IU/L (ref 0–40)
Albumin/Globulin Ratio: 1.4 (ref 1.2–2.2)
Albumin: 4.4 g/dL (ref 4.0–5.0)
Alkaline Phosphatase: 143 IU/L — ABNORMAL HIGH (ref 44–121)
BUN/Creatinine Ratio: 11 (ref 9–20)
BUN: 10 mg/dL (ref 6–24)
Bilirubin Total: 0.4 mg/dL (ref 0.0–1.2)
CO2: 20 mmol/L (ref 20–29)
Calcium: 9.4 mg/dL (ref 8.7–10.2)
Chloride: 102 mmol/L (ref 96–106)
Creatinine, Ser: 0.93 mg/dL (ref 0.76–1.27)
Globulin, Total: 3.1 g/dL (ref 1.5–4.5)
Glucose: 87 mg/dL (ref 65–99)
Potassium: 4.1 mmol/L (ref 3.5–5.2)
Sodium: 139 mmol/L (ref 134–144)
Total Protein: 7.5 g/dL (ref 6.0–8.5)
eGFR: 106 mL/min/{1.73_m2} (ref >59)

## 2021-03-16 LAB — CBC
Hematocrit: 43.8 % (ref 37.5–51.0)
Hemoglobin: 13.9 g/dL (ref 13.0–17.7)
MCH: 25.7 pg — ABNORMAL LOW (ref 26.6–33.0)
MCHC: 31.7 g/dL (ref 31.5–35.7)
MCV: 81 fL (ref 79–97)
Platelets: 269 10*3/uL (ref 150–450)
RBC: 5.4 x10E6/uL (ref 4.14–5.80)
RDW: 15.2 % (ref 11.6–15.4)
WBC: 4 10*3/uL (ref 3.4–10.8)

## 2021-03-16 LAB — HEMOGLOBIN A1C
Est. average glucose Bld gHb Est-mCnc: 128 mg/dL
Hgb A1c MFr Bld: 6.1 % — ABNORMAL HIGH (ref 4.8–5.6)

## 2021-03-16 LAB — URIC ACID: Uric Acid: 8 mg/dL (ref 3.8–8.4)

## 2021-03-16 LAB — PSA: Prostate Specific Ag, Serum: 0.9 ng/mL (ref 0.0–4.0)

## 2021-03-16 LAB — LIPID PANEL
Chol/HDL Ratio: 3.2 ratio (ref 0.0–5.0)
Cholesterol, Total: 153 mg/dL (ref 100–199)
HDL: 48 mg/dL (ref >39)
LDL Chol Calc (NIH): 77 mg/dL (ref 0–99)
Triglycerides: 163 mg/dL — ABNORMAL HIGH (ref 0–149)
VLDL Cholesterol Cal: 28 mg/dL (ref 5–40)

## 2021-03-16 LAB — VITAMIN D 25 HYDROXY (VIT D DEFICIENCY, FRACTURES): Vit D, 25-Hydroxy: 20.9 ng/mL — ABNORMAL LOW (ref 30.0–100.0)

## 2021-03-23 MED ORDER — VITAMIN D (ERGOCALCIFEROL) 1.25 MG (50000 UNIT) PO CAPS: 50000.0000 [IU] | ORAL_CAPSULE | ORAL | 1 refills | Status: DC

## 2021-06-20 ENCOUNTER — Ambulatory Visit: Payer: 59 | Admitting: Nurse Practitioner

## 2021-06-20 ENCOUNTER — Other Ambulatory Visit: Payer: Self-pay

## 2021-06-20 ENCOUNTER — Encounter: Payer: Self-pay | Admitting: Nurse Practitioner

## 2021-06-20 VITALS — BP 132/80 | HR 71 | Temp 98.4°F | Ht 66.8 in | Wt 235.0 lb

## 2021-06-20 DIAGNOSIS — R7309 Other abnormal glucose: Secondary | ICD-10-CM | POA: Diagnosis not present

## 2021-06-20 DIAGNOSIS — I10 Essential (primary) hypertension: Secondary | ICD-10-CM

## 2021-06-20 DIAGNOSIS — E781 Pure hyperglyceridemia: Secondary | ICD-10-CM | POA: Diagnosis not present

## 2021-06-20 DIAGNOSIS — M25569 Pain in unspecified knee: Secondary | ICD-10-CM | POA: Diagnosis not present

## 2021-06-20 DIAGNOSIS — Z6837 Body mass index (BMI) 37.0-37.9, adult: Secondary | ICD-10-CM

## 2021-06-20 MED ORDER — COLCHICINE 0.6 MG PO TABS: ORAL_TABLET | ORAL | 2 refills | Status: DC

## 2021-06-20 NOTE — Progress Notes (Signed)
I,[name],acting as a Education administrator for [employer], FNP.,have documented all relevant documentation on the behalf of [name], FNP,as directed by  [name], FNP while in the presence of [name], [place].  This visit occurred during the SARS-CoV-2 public health emergency.  Safety protocols were in place, including screening questions prior to the visit, additional usage of staff PPE, and extensive cleaning of exam room while observing appropriate contact time as indicated for disinfecting solutions.  Subjective:     Patient ID: Steven Leon , male    DOB: 03/25/1981 , 40 y.o.   MRN: [id number]   Chief Complaint  Patient presents with   Hypertension    HPI  Patient presents today for a blood pressure, abnormal glucose and vitamin d f/u. He has not taken his medications today.  Hypertension This is a chronic problem. The current episode started more than 1 year ago. The problem is unchanged. The problem is controlled. Pertinent negatives include no anxiety, chest pain, headaches or palpitations. Risk factors for coronary artery disease include sedentary lifestyle. Past treatments include angiotensin blockers. Compliance problems: medication adherence.     Past Medical History:  Diagnosis Date   Essential hypertension 04/04/2016   Gout    Hypertension      Family History  Problem Relation Age of Onset   Diabetes Mother    Seizures Father    Cancer Maternal Grandmother    Diabetes Maternal Grandmother    Cancer - Prostate Maternal Grandfather    Diabetes Paternal Grandmother    Hypertension Paternal Grandmother    Diabetes Maternal Aunt    Diabetes Maternal Uncle      Current Outpatient Medications:    cetirizine (ZYRTEC) 10 MG tablet, Take 10 mg by mouth daily as needed for allergies., Disp: , Rfl:    losartan (COZAAR) 50 MG tablet, TAKE 1 TABLET(50 MG) BY MOUTH DAILY, Disp: 90 tablet, Rfl: 1   colchicine 0.6 MG tablet, Take 1 tablet by mouth as needed BID, Disp:  30 tablet, Rfl: 2   No Known Allergies   Review of Systems  Constitutional: Negative.   Respiratory: Negative.    Cardiovascular: Negative.  Negative for chest pain and palpitations.  Gastrointestinal: Negative.   Neurological: Negative.  Negative for headaches.    Today's Vitals   06/20/21 1131  BP: 132/80  Pulse: 71  Temp: 98.4 F (36.9 C)  TempSrc: Oral  Weight: 235 lb (106.6 kg)  Height: 5' 6.8" (1.697 m)   Body mass index is 37.03 kg/m.  Wt Readings from Last 3 Encounters:  06/20/21 235 lb (106.6 kg)  03/15/21 245 lb 9.6 oz (111.4 kg)  08/08/20 247 lb 6.4 oz (112.2 kg)    Objective:  Physical Exam Vitals reviewed.  Constitutional:      General: He is not in acute distress.    Appearance: Normal appearance. He is obese.  Cardiovascular:     Rate and Rhythm: Normal rate and regular rhythm.     Pulses: Normal pulses.     Heart sounds: Normal heart sounds. No murmur heard. Pulmonary:     Effort: Pulmonary effort is normal. No respiratory distress.     Breath sounds: Normal breath sounds. No wheezing.  Musculoskeletal:     Cervical back: Normal range of motion and neck supple.  Skin:    General: Skin is warm and dry.     Capillary Refill: Capillary refill takes less than 2 seconds.  Neurological:     General: No focal deficit present.  Mental Status: He is alert and oriented to person, place, and time.     Cranial Nerves: No cranial nerve deficit.     Motor: No weakness.  Psychiatric:        Mood and Affect: Mood normal.        Behavior: Behavior normal.        Thought Content: Thought content normal.        Judgment: Judgment normal.        Assessment And Plan:     1. Essential hypertension Comments: Blood pressure is fairly controlled. Continue current medications.  - BMP8+EGFR  2. Abnormal glucose Comments: Stable, no current medications.  - Hemoglobin A1c - BMP8+EGFR  3. Acute knee pain, unspecified laterality Comments: No current pain  but has had some episodes of pain to ankle.  - colchicine 0.6 MG tablet; Take 1 tablet by mouth as needed BID  Dispense: 30 tablet; Refill: 2  4. High triglycerides Comments: Will recheck lipid panel. Encouraged to avoid breads and sweets.  - Lipid panel  5. Class 2 severe obesity due to excess calories with serious comorbidity and body mass index (BMI) of 37.0 to 37.9 in adult [facility]) He is encouraged to initially strive for BMI less than 30 to decrease cardiac risk. He is advised to exercise no less than 150 minutes per week.     Patient was given opportunity to ask questions. Patient verbalized understanding of the plan and was able to repeat key elements of the plan. All questions were answered to their satisfaction.  [name], FNP   I, [name], FNP, have reviewed all documentation for this visit. The documentation on 06/20/21 for the exam, diagnosis, procedures, and orders are all accurate and complete.   IF YOU HAVE BEEN REFERRED TO A SPECIALIST, IT MAY TAKE 1-2 WEEKS TO SCHEDULE/PROCESS THE REFERRAL. IF YOU HAVE NOT HEARD FROM US/SPECIALIST IN TWO WEEKS, PLEASE GIVE [place] A CALL AT [phone].   THE PATIENT IS ENCOURAGED TO PRACTICE SOCIAL DISTANCING DUE TO THE COVID-19 PANDEMIC.

## 2021-06-20 NOTE — Patient Instructions (Signed)

## 2021-06-21 LAB — HEMOGLOBIN A1C
Est. average glucose Bld gHb Est-mCnc: 120 mg/dL
Hgb A1c MFr Bld: 5.8 % — ABNORMAL HIGH (ref 4.8–5.6)

## 2021-06-21 LAB — LIPID PANEL
Chol/HDL Ratio: 3 ratio (ref 0.0–5.0)
Cholesterol, Total: 150 mg/dL (ref 100–199)
HDL: 50 mg/dL (ref >39)
LDL Chol Calc (NIH): 72 mg/dL (ref 0–99)
Triglycerides: 167 mg/dL — ABNORMAL HIGH (ref 0–149)
VLDL Cholesterol Cal: 28 mg/dL (ref 5–40)

## 2021-06-21 LAB — BMP8+EGFR
BUN/Creatinine Ratio: 8 — ABNORMAL LOW (ref 9–20)
BUN: 8 mg/dL (ref 6–24)
CO2: 21 mmol/L (ref 20–29)
Calcium: 9.3 mg/dL (ref 8.7–10.2)
Chloride: 102 mmol/L (ref 96–106)
Creatinine, Ser: 0.99 mg/dL (ref 0.76–1.27)
Glucose: 76 mg/dL (ref 70–99)
Potassium: 4.2 mmol/L (ref 3.5–5.2)
Sodium: 141 mmol/L (ref 134–144)
eGFR: 98 mL/min/{1.73_m2} (ref >59)

## 2021-10-30 ENCOUNTER — Ambulatory Visit: Payer: 59 | Admitting: Nurse Practitioner

## 2021-10-30 ENCOUNTER — Encounter: Payer: Self-pay | Admitting: Nurse Practitioner

## 2021-10-30 VITALS — BP 132/72 | HR 73 | Temp 97.9°F | Ht 66.0 in | Wt 243.2 lb

## 2021-10-30 DIAGNOSIS — M25562 Pain in left knee: Secondary | ICD-10-CM

## 2021-10-30 DIAGNOSIS — G8929 Other chronic pain: Secondary | ICD-10-CM

## 2021-10-30 DIAGNOSIS — I10 Essential (primary) hypertension: Secondary | ICD-10-CM

## 2021-10-30 DIAGNOSIS — E6609 Other obesity due to excess calories: Secondary | ICD-10-CM

## 2021-10-30 DIAGNOSIS — Z6839 Body mass index (BMI) 39.0-39.9, adult: Secondary | ICD-10-CM

## 2021-10-30 DIAGNOSIS — M238X2 Other internal derangements of left knee: Secondary | ICD-10-CM

## 2021-10-30 DIAGNOSIS — E781 Pure hyperglyceridemia: Secondary | ICD-10-CM | POA: Diagnosis not present

## 2021-10-30 DIAGNOSIS — R7309 Other abnormal glucose: Secondary | ICD-10-CM | POA: Diagnosis not present

## 2021-10-30 MED ORDER — LOSARTAN POTASSIUM 50 MG PO TABS: ORAL_TABLET | ORAL | 1 refills | Status: DC

## 2021-10-30 NOTE — Progress Notes (Signed)
I,[name],acting as a Education administrator for Steven Brine, FNP.,have documented all relevant documentation on the behalf of Steven Brine, FNP,as directed by  Steven Brine, FNP while in the presence of Steven Leon, [place].   This visit occurred during the SARS-CoV-2 public health emergency.  Safety protocols were in place, including screening questions prior to the visit, additional usage of staff PPE, and extensive cleaning of exam room while observing appropriate contact time as indicated for disinfecting solutions.  Subjective:     Patient ID: Steven Leon , male    DOB: 1980/07/26 , 41 y.o.   MRN: [id number]   Chief Complaint  Patient presents with   Hypertension    HPI  Pt here today for bpc. He has picked up some homes for work in [place] so he is in his car more than usual. Admits to eating a poor diet. Torn PCL 60% torn.   Wt Readings from Last 3 Encounters: 10/30/21 : 243 lb 3.2 oz (110.3 kg) 06/20/21 : 235 lb (106.6 kg) 03/15/21 : 245 lb 9.6 oz (111.4 kg)      Past Medical History:  Diagnosis Date   Essential hypertension 04/04/2016   Gout    Hypertension      Family History  Problem Relation Age of Onset   Diabetes Mother    Seizures Father    Cancer Maternal Grandmother    Diabetes Maternal Grandmother    Cancer - Prostate Maternal Grandfather    Diabetes Paternal Grandmother    Hypertension Paternal Grandmother    Diabetes Maternal Aunt    Diabetes Maternal Uncle      Current Outpatient Medications:    cetirizine (ZYRTEC) 10 MG tablet, Take 10 mg by mouth daily as needed for allergies., Disp: , Rfl:    colchicine 0.6 MG tablet, Take 1 tablet by mouth as needed BID, Disp: 30 tablet, Rfl: 2   losartan (COZAAR) 50 MG tablet, TAKE 1 TABLET(50 MG) BY MOUTH DAILY, Disp: 90 tablet, Rfl: 1   No Known Allergies   Review of Systems  Constitutional: Negative.   HENT: Negative.    Respiratory: Negative.    Cardiovascular: Negative.   Gastrointestinal:  Negative.   Endocrine: Negative.   Genitourinary: Negative.   Skin: Negative.   Allergic/Immunologic: Negative.   Hematological: Negative.     Today's Vitals   10/30/21 1058  BP: 132/72  Pulse: 73  Temp: 97.9 F (36.6 C)  Weight: 243 lb 3.2 oz (110.3 kg)  Height: $Remove'5\' 6"'IrDbOTu$  (1.676 m)  PainSc: 0-No pain   Body mass index is 39.25 kg/m.  Wt Readings from Last 3 Encounters:  10/30/21 243 lb 3.2 oz (110.3 kg)  06/20/21 235 lb (106.6 kg)  03/15/21 245 lb 9.6 oz (111.4 kg)    Objective:  Physical Exam Vitals reviewed.  Constitutional:      General: He is not in acute distress.    Appearance: Normal appearance. He is obese.  Cardiovascular:     Pulses: Normal pulses.     Heart sounds: Normal heart sounds. No murmur heard. Pulmonary:     Effort: Pulmonary effort is normal. No respiratory distress.     Breath sounds: Normal breath sounds.  Musculoskeletal:        General: Swelling (left knee larger than right) present.     Comments: Left knee with decreased range of motion  Skin:    General: Skin is warm and dry.  Neurological:     General: No focal deficit present.  Mental Status: He is alert and oriented to person, place, and time.     Cranial Nerves: No cranial nerve deficit.     Motor: No weakness.  Psychiatric:        Mood and Affect: Mood normal.        Behavior: Behavior normal.        Thought Content: Thought content normal.        Judgment: Judgment normal.        Assessment And Plan:     1. Essential hypertension Comments: Blood pressure is controlled, continue current medications - losartan (COZAAR) 50 MG tablet; TAKE 1 TABLET(50 MG) BY MOUTH DAILY  Dispense: 90 tablet; Refill: 1 - CMP14+EGFR  2. Abnormal glucose Comments: HgbA1c was improved at last visit, continue focusing on healthy diet and incorporating more physical activity - Hemoglobin A1c  3. High triglycerides Comments: Slightly improved at last visit - CMP14+EGFR - Lipid panel  4.  Chronic pain of left knee Comments: Related to an old injury at 41 years old, I have encouraged him to get more information from orthopedic on how much activity should be on his knee to avoid inju - Ambulatory referral to Orthopedic Surgery  5. PCL deficiency, knee, left Comments: Referral to Orthopedics done - Ambulatory referral to Orthopedic Surgery  6. Class 2 obesity due to excess calories without serious comorbidity with body mass index (BMI) of 39.0 to 39.9 in adult He is encouraged to strive for BMI less than 30 to decrease cardiac risk. Advised to aim for at least 150 minutes of exercise per week.    Patient was given opportunity to ask questions. Patient verbalized understanding of the plan and was able to repeat key elements of the plan. All questions were answered to their satisfaction.  Steven Brine, FNP   I, Steven Brine, FNP, have reviewed all documentation for this visit. The documentation on 10/30/21 for the exam, diagnosis, procedures, and orders are all accurate and complete.   IF YOU HAVE BEEN REFERRED TO A SPECIALIST, IT MAY TAKE 1-2 WEEKS TO SCHEDULE/PROCESS THE REFERRAL. IF YOU HAVE NOT HEARD FROM US/SPECIALIST IN TWO WEEKS, PLEASE GIVE [place] A CALL AT [phone].   THE PATIENT IS ENCOURAGED TO PRACTICE SOCIAL DISTANCING DUE TO THE COVID-19 PANDEMIC.

## 2021-10-30 NOTE — Patient Instructions (Signed)
Hypertension, Adult ?Hypertension is another name for high blood pressure. High blood pressure forces your heart to work harder to pump blood. This can cause problems over time. ?There are two numbers in a blood pressure reading. There is a top number (systolic) over a bottom number (diastolic). It is best to have a blood pressure that is below 120/80. ?What are the causes? ?The cause of this condition is not known. Some other conditions can lead to high blood pressure. ?What increases the risk? ?Some lifestyle factors can make you more likely to develop high blood pressure: ?Smoking. ?Not getting enough exercise or physical activity. ?Being overweight. ?Having too much fat, sugar, calories, or salt (sodium) in your diet. ?Drinking too much alcohol. ?Other risk factors include: ?Having any of these conditions: ?Heart disease. ?Diabetes. ?High cholesterol. ?Kidney disease. ?Obstructive sleep apnea. ?Having a family history of high blood pressure and high cholesterol. ?Age. The risk increases with age. ?Stress. ?What are the signs or symptoms? ?High blood pressure may not cause symptoms. Very high blood pressure (hypertensive crisis) may cause: ?Headache. ?Fast or uneven heartbeats (palpitations). ?Shortness of breath. ?Nosebleed. ?Vomiting or feeling like you may vomit (nauseous). ?Changes in how you see. ?Very bad chest pain. ?Feeling dizzy. ?Seizures. ?How is this treated? ?This condition is treated by making healthy lifestyle changes, such as: ?Eating healthy foods. ?Exercising more. ?Drinking less alcohol. ?Your doctor may prescribe medicine if lifestyle changes do not help enough and if: ?Your top number is above 130. ?Your bottom number is above 80. ?Your personal target blood pressure may vary. ?Follow these instructions at home: ?Eating and drinking ? ?If told, follow the DASH eating plan. To follow this plan: ?Fill one half of your plate at each meal with fruits and vegetables. ?Fill one fourth of your plate  at each meal with whole grains. Whole grains include whole-wheat pasta, brown rice, and whole-grain bread. ?Eat or drink low-fat dairy products, such as skim milk or low-fat yogurt. ?Fill one fourth of your plate at each meal with low-fat (lean) proteins. Low-fat proteins include fish, chicken without skin, eggs, beans, and tofu. ?Avoid fatty meat, cured and processed meat, or chicken with skin. ?Avoid pre-made or processed food. ?Limit the amount of salt in your diet to less than 1,500 mg each day. ?Do not drink alcohol if: ?Your doctor tells you not to drink. ?You are pregnant, may be pregnant, or are planning to become pregnant. ?If you drink alcohol: ?Limit how much you have to: ?0-1 drink a day for women. ?0-2 drinks a day for men. ?Know how much alcohol is in your drink. In the [place] one drink equals one 12 oz bottle of beer (355 mL), one 5 oz glass of wine (148 mL), or one 1? oz glass of hard liquor (44 mL). ?Lifestyle ? ?Work with your doctor to stay at a healthy weight or to lose weight. Ask your doctor what the best weight is for you. ?Get at least 30 minutes of exercise that causes your heart to beat faster (aerobic exercise) most days of the week. This may include walking, swimming, or biking. ?Get at least 30 minutes of exercise that strengthens your muscles (resistance exercise) at least 3 days a week. This may include lifting weights or doing Pilates. ?Do not smoke or use any products that contain nicotine or tobacco. If you need help quitting, ask your doctor. ?Check your blood pressure at home as told by your doctor. ?Keep all follow-up visits. ?Medicines ?Take over-the-counter and prescription medicines   only as told by your doctor. Follow directions carefully. ?Do not skip doses of blood pressure medicine. The medicine does not work as well if you skip doses. Skipping doses also puts you at risk for problems. ?Ask your doctor about side effects or reactions to medicines that you should watch  for. ?Contact a doctor if: ?You think you are having a reaction to the medicine you are taking. ?You have headaches that keep coming back. ?You feel dizzy. ?You have swelling in your ankles. ?You have trouble with your vision. ?Get help right away if: ?You get a very bad headache. ?You start to feel mixed up (confused). ?You feel weak or numb. ?You feel faint. ?You have very bad pain in your: ?Chest. ?Belly (abdomen). ?You vomit more than once. ?You have trouble breathing. ?These symptoms may be an emergency. Get help right away. Call 911. ?Do not wait to see if the symptoms will go away. ?Do not drive yourself to the hospital. ?Summary ?Hypertension is another name for high blood pressure. ?High blood pressure forces your heart to work harder to pump blood. ?For most people, a normal blood pressure is less than 120/80. ?Making healthy choices can help lower blood pressure. If your blood pressure does not get lower with healthy choices, you may need to take medicine. ?This information is not intended to replace advice given to you by your health care provider. Make sure you discuss any questions you have with your health care provider. ?Document Revised: 03/29/2021 Document Reviewed: 03/29/2021 ?Elsevier Patient Education ? 2023 Elsevier Inc. ? ?

## 2021-10-31 LAB — CMP14+EGFR
ALT: 33 IU/L (ref 0–44)
AST: 24 IU/L (ref 0–40)
Albumin/Globulin Ratio: 1.3 (ref 1.2–2.2)
Albumin: 4.3 g/dL (ref 4.0–5.0)
Alkaline Phosphatase: 162 IU/L — ABNORMAL HIGH (ref 44–121)
BUN/Creatinine Ratio: 6 — ABNORMAL LOW (ref 9–20)
BUN: 6 mg/dL (ref 6–24)
Bilirubin Total: 0.3 mg/dL (ref 0.0–1.2)
CO2: 23 mmol/L (ref 20–29)
Calcium: 9.8 mg/dL (ref 8.7–10.2)
Chloride: 102 mmol/L (ref 96–106)
Creatinine, Ser: 0.93 mg/dL (ref 0.76–1.27)
Globulin, Total: 3.2 g/dL (ref 1.5–4.5)
Glucose: 89 mg/dL (ref 70–99)
Potassium: 4.5 mmol/L (ref 3.5–5.2)
Sodium: 139 mmol/L (ref 134–144)
Total Protein: 7.5 g/dL (ref 6.0–8.5)
eGFR: 105 mL/min/{1.73_m2} (ref >59)

## 2021-10-31 LAB — LIPID PANEL
Chol/HDL Ratio: 2.8 ratio (ref 0.0–5.0)
Cholesterol, Total: 150 mg/dL (ref 100–199)
HDL: 53 mg/dL (ref >39)
LDL Chol Calc (NIH): 72 mg/dL (ref 0–99)
Triglycerides: 142 mg/dL (ref 0–149)
VLDL Cholesterol Cal: 25 mg/dL (ref 5–40)

## 2021-10-31 LAB — HEMOGLOBIN A1C
Est. average glucose Bld gHb Est-mCnc: 117 mg/dL
Hgb A1c MFr Bld: 5.7 % — ABNORMAL HIGH (ref 4.8–5.6)

## 2021-12-28 ENCOUNTER — Encounter: Payer: Self-pay | Admitting: Nurse Practitioner

## 2021-12-28 ENCOUNTER — Other Ambulatory Visit: Payer: Self-pay

## 2021-12-28 DIAGNOSIS — M25569 Pain in unspecified knee: Secondary | ICD-10-CM

## 2021-12-28 MED ORDER — COLCHICINE 0.6 MG PO TABS: ORAL_TABLET | ORAL | 2 refills | Status: DC

## 2022-03-21 ENCOUNTER — Encounter: Payer: 59 | Admitting: Nurse Practitioner

## 2022-04-29 ENCOUNTER — Ambulatory Visit: Payer: 59 | Admitting: Nurse Practitioner

## 2022-04-29 ENCOUNTER — Encounter: Payer: Self-pay | Admitting: Nurse Practitioner

## 2022-04-29 ENCOUNTER — Other Ambulatory Visit: Payer: Self-pay

## 2022-04-29 VITALS — BP 136/76 | HR 72 | Temp 98.1°F | Ht 66.0 in | Wt 236.0 lb

## 2022-04-29 DIAGNOSIS — R7309 Other abnormal glucose: Secondary | ICD-10-CM

## 2022-04-29 DIAGNOSIS — Z2821 Immunization not carried out because of patient refusal: Secondary | ICD-10-CM

## 2022-04-29 DIAGNOSIS — R748 Abnormal levels of other serum enzymes: Secondary | ICD-10-CM

## 2022-04-29 DIAGNOSIS — M25569 Pain in unspecified knee: Secondary | ICD-10-CM

## 2022-04-29 DIAGNOSIS — I10 Essential (primary) hypertension: Secondary | ICD-10-CM | POA: Diagnosis not present

## 2022-04-29 DIAGNOSIS — E781 Pure hyperglyceridemia: Secondary | ICD-10-CM | POA: Diagnosis not present

## 2022-04-29 DIAGNOSIS — Z6838 Body mass index (BMI) 38.0-38.9, adult: Secondary | ICD-10-CM

## 2022-04-29 MED ORDER — COLCHICINE 0.6 MG PO TABS: ORAL_TABLET | ORAL | 2 refills | Status: DC

## 2022-04-29 NOTE — Progress Notes (Signed)
I,[name],acting as a Education administrator for [employer], FNP.,have documented all relevant documentation on the behalf of [name], FNP,as directed by  [name], FNP while in the presence of [name], [place].  Subjective:     Patient ID: Steven Leon , male    DOB: 07-07-1980 , 41 y.o.   MRN: [id number]   Chief Complaint  Patient presents with   Hypertension    HPI  Pt here today for bpc. He is taking medications daily but did not take this morning. He is walking more than before. Admits diet is still not the best but increased water intake.   Wt Readings from Last 3 Encounters: 04/29/22 : 236 lb (107 kg) 10/30/21 : 243 lb 3.2 oz (110.3 kg) 06/20/21 : 235 lb (106.6 kg)    Hypertension This is a chronic problem. The current episode started more than 1 year ago. The problem is unchanged. Pertinent negatives include no anxiety. Risk factors for coronary artery disease include obesity and sedentary lifestyle. Past treatments include angiotensin blockers. There are no compliance problems.  There is no history of angina or kidney disease. There is no history of chronic renal disease.     Past Medical History:  Diagnosis Date   Essential hypertension 04/04/2016   Gout    Hypertension      Family History  Problem Relation Age of Onset   Diabetes Mother    Seizures Father    Cancer Maternal Grandmother    Diabetes Maternal Grandmother    Cancer - Prostate Maternal Grandfather    Diabetes Paternal Grandmother    Hypertension Paternal Grandmother    Diabetes Maternal Aunt    Diabetes Maternal Uncle      Current Outpatient Medications:    cetirizine (ZYRTEC) 10 MG tablet, Take 10 mg by mouth daily as needed for allergies., Disp: , Rfl:    losartan (COZAAR) 50 MG tablet, TAKE 1 TABLET(50 MG) BY MOUTH DAILY, Disp: 90 tablet, Rfl: 1   colchicine 0.6 MG tablet, Take 1 tablet by mouth as needed BID, Disp: 30 tablet, Rfl: 2   No Known Allergies   Review of Systems   Constitutional: Negative.   Respiratory: Negative.    Cardiovascular: Negative.   Gastrointestinal: Negative.   Neurological: Negative.   Psychiatric/Behavioral: Negative.       Today's Vitals   04/29/22 1213  BP: 136/76  Pulse: 72  Temp: 98.1 F (36.7 C)  TempSrc: Oral  Weight: 236 lb (107 kg)  Height: _0  (1.676 m)   Body mass index is 38.09 kg/m.  Wt Readings from Last 3 Encounters:  04/29/22 236 lb (107 kg)  10/30/21 243 lb 3.2 oz (110.3 kg)  06/20/21 235 lb (106.6 kg)    Objective:  Physical Exam Vitals reviewed.  Constitutional:      General: He is not in acute distress.    Appearance: Normal appearance. He is obese.  Cardiovascular:     Pulses: Normal pulses.     Heart sounds: Normal heart sounds. No murmur heard. Pulmonary:     Effort: Pulmonary effort is normal. No respiratory distress.     Breath sounds: Normal breath sounds.  Skin:    General: Skin is warm and dry.  Neurological:     General: No focal deficit present.     Mental Status: He is alert and oriented to person, place, and time.     Cranial Nerves: No cranial nerve deficit.     Motor: No weakness.  Psychiatric:  Mood and Affect: Mood normal.        Behavior: Behavior normal.        Thought Content: Thought content normal.        Judgment: Judgment normal.         Assessment And Plan:     1. Essential hypertension Comments: Blood pressure is fairly controlled, he has not taken his medications this morning. - CMP14+EGFR  2. Abnormal glucose Comments: HgbA1c is improving at last visit, he has had a 6 lb weight loss so hopefully this improves his A1c more. - Hemoglobin A1c  3. High triglycerides Comments: Continue focusing on a low fat diet. - Lipid panel  4. Elevated alkaline phosphatase level Comments: Will check vitamin d. Has cut back on processed foods. - VITAMIN D 25 Hydroxy (Vit-D Deficiency, Fractures)  5. Influenza vaccination declined Patient declined  influenza vaccination at this time. Patient is aware that influenza vaccine prevents illness in 70% of healthy people, and reduces hospitalizations to 30-70% in elderly. This vaccine is recommended annually. Education has been provided regarding the importance of this vaccine but patient still declined. Advised may receive this vaccine at local pharmacy or Health Dept.or vaccine clinic. Aware to provide a copy of the vaccination record if obtained from local pharmacy or Health Dept.  Pt is willing to accept risk associated with refusing vaccination.  6. Class 2 severe obesity due to excess calories with serious comorbidity and body mass index (BMI) of 38.0 to 38.9 in adult [facility]) Congratulated on his 6 lb weight loss. He is encouraged to initially strive for BMI less than 30 to decrease cardiac risk. He is advised to exercise no less than 150 minutes per week.    Patient was given opportunity to ask questions. Patient verbalized understanding of the plan and was able to repeat key elements of the plan. All questions were answered to their satisfaction.  [name], FNP   I, [name], FNP, have reviewed all documentation for this visit. The documentation on 04/29/22 for the exam, diagnosis, procedures, and orders are all accurate and complete.   IF YOU HAVE BEEN REFERRED TO A SPECIALIST, IT MAY TAKE 1-2 WEEKS TO SCHEDULE/PROCESS THE REFERRAL. IF YOU HAVE NOT HEARD FROM US/SPECIALIST IN TWO WEEKS, PLEASE GIVE [place] A CALL AT [phone].   THE PATIENT IS ENCOURAGED TO PRACTICE SOCIAL DISTANCING DUE TO THE COVID-19 PANDEMIC.

## 2022-04-29 NOTE — Patient Instructions (Signed)

## 2022-04-30 LAB — CMP14+EGFR
ALT: 30 IU/L (ref 0–44)
AST: 26 IU/L (ref 0–40)
Albumin/Globulin Ratio: 1.5 (ref 1.2–2.2)
Albumin: 4.4 g/dL (ref 4.1–5.1)
Alkaline Phosphatase: 147 IU/L — ABNORMAL HIGH (ref 44–121)
BUN/Creatinine Ratio: 13 (ref 9–20)
BUN: 14 mg/dL (ref 6–24)
Bilirubin Total: 0.3 mg/dL (ref 0.0–1.2)
CO2: 21 mmol/L (ref 20–29)
Calcium: 9.4 mg/dL (ref 8.7–10.2)
Chloride: 106 mmol/L (ref 96–106)
Creatinine, Ser: 1.1 mg/dL (ref 0.76–1.27)
Globulin, Total: 3 g/dL (ref 1.5–4.5)
Glucose: 110 mg/dL — ABNORMAL HIGH (ref 70–99)
Potassium: 4.6 mmol/L (ref 3.5–5.2)
Sodium: 143 mmol/L (ref 134–144)
Total Protein: 7.4 g/dL (ref 6.0–8.5)
eGFR: 86 mL/min/{1.73_m2} (ref >59)

## 2022-04-30 LAB — LIPID PANEL
Chol/HDL Ratio: 2.7 ratio (ref 0.0–5.0)
Cholesterol, Total: 142 mg/dL (ref 100–199)
HDL: 52 mg/dL (ref >39)
LDL Chol Calc (NIH): 66 mg/dL (ref 0–99)
Triglycerides: 141 mg/dL (ref 0–149)
VLDL Cholesterol Cal: 24 mg/dL (ref 5–40)

## 2022-04-30 LAB — VITAMIN D 25 HYDROXY (VIT D DEFICIENCY, FRACTURES): Vit D, 25-Hydroxy: 31 ng/mL (ref 30.0–100.0)

## 2022-04-30 LAB — HEMOGLOBIN A1C
Est. average glucose Bld gHb Est-mCnc: 120 mg/dL
Hgb A1c MFr Bld: 5.8 % — ABNORMAL HIGH (ref 4.8–5.6)

## 2022-07-04 ENCOUNTER — Encounter: Payer: Self-pay | Admitting: Nurse Practitioner

## 2022-07-04 ENCOUNTER — Ambulatory Visit: Payer: 59 | Admitting: Nurse Practitioner

## 2022-07-04 VITALS — BP 132/72 | HR 68 | Temp 98.5°F | Ht 66.0 in | Wt 236.0 lb

## 2022-07-04 DIAGNOSIS — I10 Essential (primary) hypertension: Secondary | ICD-10-CM

## 2022-07-04 DIAGNOSIS — E781 Pure hyperglyceridemia: Secondary | ICD-10-CM

## 2022-07-04 DIAGNOSIS — Z6838 Body mass index (BMI) 38.0-38.9, adult: Secondary | ICD-10-CM

## 2022-07-04 DIAGNOSIS — Z0000 Encounter for general adult medical examination without abnormal findings: Secondary | ICD-10-CM

## 2022-07-04 DIAGNOSIS — R7309 Other abnormal glucose: Secondary | ICD-10-CM | POA: Diagnosis not present

## 2022-07-04 DIAGNOSIS — F5102 Adjustment insomnia: Secondary | ICD-10-CM

## 2022-07-04 DIAGNOSIS — R0683 Snoring: Secondary | ICD-10-CM

## 2022-07-04 DIAGNOSIS — Z125 Encounter for screening for malignant neoplasm of prostate: Secondary | ICD-10-CM

## 2022-07-04 MED ORDER — MAGNESIUM GLUCONATE 250 MG PO TABS: 1.0000 | ORAL_TABLET | Freq: Every evening | ORAL | 5 refills | Status: AC | PRN

## 2022-07-04 MED ORDER — LOSARTAN POTASSIUM 50 MG PO TABS: ORAL_TABLET | ORAL | 1 refills | Status: DC

## 2022-07-04 NOTE — Patient Instructions (Signed)
Health Maintenance, Male Adopting a healthy lifestyle and getting preventive care are important in promoting health and wellness. Ask your health care provider about: The right schedule for you to have regular tests and exams. Things you can do on your own to prevent diseases and keep yourself healthy. What should I know about diet, weight, and exercise? Eat a healthy diet  Eat a diet that includes plenty of vegetables, fruits, low-fat dairy products, and lean protein. Do not eat a lot of foods that are high in solid fats, added sugars, or sodium. Maintain a healthy weight Body mass index (BMI) is a measurement that can be used to identify possible weight problems. It estimates body fat based on height and weight. Your health care provider can help determine your BMI and help you achieve or maintain a healthy weight. Get regular exercise Get regular exercise. This is one of the most important things you can do for your health. Most adults should: Exercise for at least 150 minutes each week. The exercise should increase your heart rate and make you sweat (moderate-intensity exercise). Do strengthening exercises at least twice a week. This is in addition to the moderate-intensity exercise. Spend less time sitting. Even light physical activity can be beneficial. Watch cholesterol and blood lipids Have your blood tested for lipids and cholesterol at 43 years of age, then have this test every 5 years. You may need to have your cholesterol levels checked more often if: Your lipid or cholesterol levels are high. You are older than 43 years of age. You are at high risk for heart disease. What should I know about cancer screening? Many types of cancers can be detected early and may often be prevented. Depending on your health history and family history, you may need to have cancer screening at various ages. This may include screening for: Colorectal cancer. Prostate cancer. Skin cancer. Lung  cancer. What should I know about heart disease, diabetes, and high blood pressure? Blood pressure and heart disease High blood pressure causes heart disease and increases the risk of stroke. This is more likely to develop in people who have high blood pressure readings or are overweight. Talk with your health care provider about your target blood pressure readings. Have your blood pressure checked: Every 3-5 years if you are 18-39 years of age. Every year if you are 40 years old or older. If you are between the ages of 65 and 75 and are a current or former smoker, ask your health care provider if you should have a one-time screening for abdominal aortic aneurysm (AAA). Diabetes Have regular diabetes screenings. This checks your fasting blood sugar level. Have the screening done: Once every three years after age 45 if you are at a normal weight and have a low risk for diabetes. More often and at a younger age if you are overweight or have a high risk for diabetes. What should I know about preventing infection? Hepatitis B If you have a higher risk for hepatitis B, you should be screened for this virus. Talk with your health care provider to find out if you are at risk for hepatitis B infection. Hepatitis C Blood testing is recommended for: Everyone born from 1945 through 1965. Anyone with known risk factors for hepatitis C. Sexually transmitted infections (STIs) You should be screened each year for STIs, including gonorrhea and chlamydia, if: You are sexually active and are younger than 43 years of age. You are older than 43 years of age and your   health care provider tells you that you are at risk for this type of infection. Your sexual activity has changed since you were last screened, and you are at increased risk for chlamydia or gonorrhea. Ask your health care provider if you are at risk. Ask your health care provider about whether you are at high risk for HIV. Your health care provider  may recommend a prescription medicine to help prevent HIV infection. If you choose to take medicine to prevent HIV, you should first get tested for HIV. You should then be tested every 3 months for as long as you are taking the medicine. Follow these instructions at home: Alcohol use Do not drink alcohol if your health care provider tells you not to drink. If you drink alcohol: Limit how much you have to 0-2 drinks a day. Know how much alcohol is in your drink. In the [place] one drink equals one 12 oz bottle of beer (355 mL), one 5 oz glass of wine (148 mL), or one 1 oz glass of hard liquor (44 mL). Lifestyle Do not use any products that contain nicotine or tobacco. These products include cigarettes, chewing tobacco, and vaping devices, such as e-cigarettes. If you need help quitting, ask your health care provider. Do not use street drugs. Do not share needles. Ask your health care provider for help if you need support or information about quitting drugs. General instructions Schedule regular health, dental, and eye exams. Stay current with your vaccines. Tell your health care provider if: You often feel depressed. You have ever been abused or do not feel safe at home. Summary Adopting a healthy lifestyle and getting preventive care are important in promoting health and wellness. Follow your health care provider's instructions about healthy diet, exercising, and getting tested or screened for diseases. Follow your health care provider's instructions on monitoring your cholesterol and blood pressure. This information is not intended to replace advice given to you by your health care provider. Make sure you discuss any questions you have with your health care provider. Document Revised: 10/30/2020 Document Reviewed: 10/30/2020 Elsevier Patient Education  2023 Elsevier Inc.  

## 2022-07-04 NOTE — Progress Notes (Signed)
I,[name],acting as a Education administrator for [employer], FNP.,have documented all relevant documentation on the behalf of [name], FNP,as directed by  [name], FNP while in the presence of [name], [place].  Subjective:     Patient ID: Steven Leon , male    DOB: [birth year] , 42 y.o.   MRN: [id number]   Chief Complaint  Patient presents with   Annual Exam    HPI  Here for health maintenance. He picked up a job over the holidays so he was walking more.   Wt Readings from Last 3 Encounters: 07/04/22 : 236 lb (107 kg) 04/29/22 : 236 lb (107 kg) 10/30/21 : 243 lb 3.2 oz (110.3 kg)  He has been having more difficulty with sleep at night and will toss and turn overnight.        Past Medical History:  Diagnosis Date   Essential hypertension 04/04/2016   Gout    Hypertension      Family History  Problem Relation Age of Onset   Diabetes Mother    Seizures Father    Cancer Maternal Grandmother    Diabetes Maternal Grandmother    Cancer - Prostate Maternal Grandfather    Diabetes Paternal Grandmother    Hypertension Paternal Grandmother    Diabetes Maternal Aunt    Diabetes Maternal Uncle      Current Outpatient Medications:    Magnesium Gluconate 250 MG TABS, Take 1 tablet (250 mg total) by mouth at bedtime as needed., Disp: 30 tablet, Rfl: 5   cetirizine (ZYRTEC) 10 MG tablet, Take 10 mg by mouth daily as needed for allergies., Disp: , Rfl:    colchicine 0.6 MG tablet, Take 1 tablet by mouth as needed BID, Disp: 30 tablet, Rfl: 2   losartan (COZAAR) 50 MG tablet, TAKE 1 TABLET(50 MG) BY MOUTH DAILY, Disp: 90 tablet, Rfl: 1   No Known Allergies   Men's preventive visit. Patient Health Questionnaire (PHQ-2) is  [place] Office Visit from 07/04/2022 in [facility]  PHQ-2 Total Score 0     Patient is on a regular diet. Exercising with walking no gym time. Marital status: Married. Relevant history for alcohol use is:   Social History   Substance and Sexual Activity  Alcohol Use Yes   Alcohol/week: 1.0 standard drink of alcohol   Types: 1 Cans of beer per week  . Relevant history for tobacco use is:  Social History   Tobacco Use  Smoking Status Never  Smokeless Tobacco Never  .   Review of Systems  Constitutional: Negative.   Eyes: Negative.   Respiratory: Negative.    Cardiovascular: Negative.   Gastrointestinal: Negative.   Endocrine: Negative.   Genitourinary: Negative.   Musculoskeletal: Negative.   Allergic/Immunologic: Negative.   Neurological: Negative.   Hematological: Negative.   Psychiatric/Behavioral: Negative.         Insomnia     Today's Vitals   07/04/22 1124  BP: 132/72  Pulse: 68  Temp: 98.5 F (36.9 C)  TempSrc: Oral  Weight: 236 lb (107 kg)  Height: 5\' 6"  (1.676 m)   Body mass index is 38.09 kg/m.   Objective:  Physical Exam Vitals reviewed.  Constitutional:      General: He is not in acute distress.    Appearance: Normal appearance. He is obese.  HENT:     Head: Normocephalic and atraumatic.     Right Ear: Tympanic membrane, ear canal and external ear normal. There is no  impacted cerumen.     Left Ear: Tympanic membrane, ear canal and external ear normal. There is no impacted cerumen.     Nose: Nose normal.     Mouth/Throat:     Mouth: Mucous membranes are moist.  Cardiovascular:     Rate and Rhythm: Normal rate and regular rhythm.     Pulses: Normal pulses.     Heart sounds: Normal heart sounds. No murmur heard. Pulmonary:     Effort: Pulmonary effort is normal. No respiratory distress.     Breath sounds: Normal breath sounds. No wheezing.  Abdominal:     General: Abdomen is flat. Bowel sounds are normal. There is no distension.     Palpations: Abdomen is soft.  Genitourinary:    Comments: Deferred Musculoskeletal:        General: No swelling. Normal range of motion.     Cervical back: Normal range of motion and neck supple.  Skin:     General: Skin is warm and dry.     Capillary Refill: Capillary refill takes less than 2 seconds.  Neurological:     General: No focal deficit present.     Mental Status: He is alert and oriented to person, place, and time.  Psychiatric:        Mood and Affect: Mood normal.        Behavior: Behavior normal.        Thought Content: Thought content normal.        Judgment: Judgment normal.         Assessment And Plan:    1. Encounter for annual physical exam  2. Encounter for prostate cancer screening - PSA  3. Essential hypertension Comments: Blood pressure is controlled. EKG done with anterior fascicular block HR 72 - POCT Urinalysis Dipstick (81002) - Microalbumin / Creatinine Urine Ratio - EKG 12-Lead - losartan (COZAAR) 50 MG tablet; TAKE 1 TABLET(50 MG) BY MOUTH DAILY  Dispense: 90 tablet; Refill: 1  4. Abnormal glucose Comments: Stable, diet controlled, continue focusing on diet low in sugar and starches.  5. High triglycerides Comments: Discussed avoiding reds and sweets.  Will check lipid panel today  6. Snoring Comments: Will refer to get a sleep study.  Discussed the risk of sleep apnea and cardiovascular events. - Ambulatory referral to Sleep Studies  7. Adjustment insomnia Comments: Encouraged to work on sleep hygiene.  Avoid using computers or cell phone at least 1 to 2-hour prior to going to bed. - Magnesium Gluconate 250 MG TABS; Take 1 tablet (250 mg total) by mouth at bedtime as needed.  Dispense: 30 tablet; Refill: 5  8. Class 2 severe obesity due to excess calories with serious comorbidity and body mass index (BMI) of 38.0 to 38.9 in adult [facility]) She is encouraged to strive for BMI less than 30 to decrease cardiac risk. Advised to aim for at least 150 minutes of exercise per week. - Ambulatory referral to Sleep Studies    Patient was given opportunity to ask questions. Patient verbalized understanding of the plan and was able to repeat key elements of  the plan. All questions were answered to their satisfaction.   [name], FNP   I, [name], FNP, have reviewed all documentation for this visit. The documentation on 07/04/22 for the exam, diagnosis, procedures, and orders are all accurate and complete.   THE PATIENT IS ENCOURAGED TO PRACTICE SOCIAL DISTANCING DUE TO THE COVID-19 PANDEMIC.

## 2022-07-05 LAB — MICROALBUMIN / CREATININE URINE RATIO
Creatinine, Urine: 100.3 mg/dL
Microalb/Creat Ratio: 4 mg/g creat (ref 0–29)
Microalbumin, Urine: 4.1 ug/mL

## 2022-07-05 LAB — PSA: Prostate Specific Ag, Serum: 0.8 ng/mL (ref 0.0–4.0)

## 2022-11-04 ENCOUNTER — Ambulatory Visit: Payer: 59 | Admitting: Nurse Practitioner

## 2022-11-04 ENCOUNTER — Encounter: Payer: Self-pay | Admitting: Nurse Practitioner

## 2022-11-04 VITALS — BP 130/76 | HR 62 | Temp 98.1°F | Ht 66.0 in | Wt 234.4 lb

## 2022-11-04 DIAGNOSIS — M25512 Pain in left shoulder: Secondary | ICD-10-CM | POA: Insufficient documentation

## 2022-11-04 DIAGNOSIS — R7309 Other abnormal glucose: Secondary | ICD-10-CM | POA: Insufficient documentation

## 2022-11-04 DIAGNOSIS — M10471 Other secondary gout, right ankle and foot: Secondary | ICD-10-CM

## 2022-11-04 DIAGNOSIS — M79674 Pain in right toe(s): Secondary | ICD-10-CM | POA: Insufficient documentation

## 2022-11-04 DIAGNOSIS — M109 Gout, unspecified: Secondary | ICD-10-CM | POA: Diagnosis not present

## 2022-11-04 DIAGNOSIS — I10 Essential (primary) hypertension: Secondary | ICD-10-CM

## 2022-11-04 MED ORDER — COLCHICINE 0.6 MG PO TABS: ORAL_TABLET | ORAL | 2 refills | Status: DC

## 2022-11-04 NOTE — Progress Notes (Signed)
Hershal Coria Martin,acting as a Neurosurgeon for Arnette Felts, FNP.,have documented all relevant documentation on the behalf of Arnette Felts, FNP,as directed by  Arnette Felts, FNP while in the presence of Arnette Felts, FNP.    Subjective:     Patient ID: Steven Leon , male    DOB: 1981/01/27 , 42 y.o.   MRN: [id number]   Chief Complaint  Patient presents with   Hypertension   Diabetes    HPI  Patient presents today for BP check, patient states compliance with medications and has no other concerns today. Patient does report a goat flare this weekend but is much better today with no pain. He has had only 2 flare ups since his last refill.   BP Readings from Last 3 Encounters: 11/04/22 : 130/76 07/04/22 : 132/72 04/29/22 : 136/76       Past Medical History:  Diagnosis Date   Essential hypertension 04/04/2016   Gout    Hypertension      Family History  Problem Relation Age of Onset   Diabetes Mother    Seizures Father    Cancer Maternal Grandmother    Diabetes Maternal Grandmother    Cancer - Prostate Maternal Grandfather    Diabetes Paternal Grandmother    Hypertension Paternal Grandmother    Diabetes Maternal Aunt    Diabetes Maternal Uncle      Current Outpatient Medications:    cetirizine (ZYRTEC) 10 MG tablet, Take 10 mg by mouth daily as needed for allergies., Disp: , Rfl:    losartan (COZAAR) 50 MG tablet, TAKE 1 TABLET(50 MG) BY MOUTH DAILY, Disp: 90 tablet, Rfl: 1   Magnesium Gluconate 250 MG TABS, Take 1 tablet (250 mg total) by mouth at bedtime as needed., Disp: 30 tablet, Rfl: 5   colchicine 0.6 MG tablet, Take 1 tablet by mouth as needed BID, Disp: 30 tablet, Rfl: 2   No Known Allergies   Review of Systems  Constitutional: Negative.   Respiratory: Negative.    Cardiovascular: Negative.   Gastrointestinal: Negative.   Neurological: Negative.   Psychiatric/Behavioral: Negative.       Today's Vitals   11/04/22 1012  BP: 130/76  Pulse: 62  Temp:  98.1 F (36.7 C)  TempSrc: Oral  Weight: 234 lb 6.4 oz (106.3 kg)  Height: 5\' 6"  (1.676 m)  PainSc: 0-No pain   Body mass index is 37.83 kg/m.  The 10-year ASCVD risk score (Arnett DK, et al., 2019) is: 5.6%   Values used to calculate the score:     Age: 20 years     Sex: Male     Is Non-Hispanic African American: Yes     Diabetic: No     Tobacco smoker: No     Systolic Blood Pressure: 130 mmHg     Is BP treated: Yes     HDL Cholesterol: 52 mg/dL     Total Cholesterol: 142 mg/dL   Wt Readings from Last 3 Encounters:  11/04/22 234 lb 6.4 oz (106.3 kg)  07/04/22 236 lb (107 kg)  04/29/22 236 lb (107 kg)    Objective:  Physical Exam Vitals reviewed.  Constitutional:      General: He is not in acute distress.    Appearance: Normal appearance. He is obese.  Cardiovascular:     Pulses: Normal pulses.     Heart sounds: Normal heart sounds. No murmur heard. Pulmonary:     Effort: Pulmonary effort is normal. No respiratory distress.     Breath sounds:  Normal breath sounds.  Skin:    General: Skin is warm and dry.  Neurological:     General: No focal deficit present.     Mental Status: He is alert and oriented to person, place, and time.     Cranial Nerves: No cranial nerve deficit.     Motor: No weakness.  Psychiatric:        Mood and Affect: Mood normal.        Behavior: Behavior normal.        Thought Content: Thought content normal.        Judgment: Judgment normal.         Assessment And Plan:     1. Essential hypertension Comments: Blood pressure is controlled.  Continue current medications. - Basic metabolic panel  2. Abnormal glucose Comments: Hemoglobin A1c has been stable.  He is encouraged to continue avoiding foods high in sugar and carbohydrates.  Increase physical activity 150 minutes/week - Hemoglobin A1c  3. Acute gout of left shoulder, unspecified cause Comments: stable, no significant issues, will check uric acid. - colchicine 0.6 MG tablet;  Take 1 tablet by mouth as needed BID  Dispense: 30 tablet; Refill: 2 - Uric acid  4. Acute gout due to other secondary cause involving toe of right foot Comments: Improved after cochicine, he was also drinking increased amounts of water - Uric acid    Return for hypertension check 4 months.   Patient was given opportunity to ask questions. Patient verbalized understanding of the plan and was able to repeat key elements of the plan. All questions were answered to their satisfaction.  Arnette Felts, FNP   I, Arnette Felts, FNP, have reviewed all documentation for this visit. The documentation on 11/04/22 for the exam, diagnosis, procedures, and orders are all accurate and complete.   IF YOU HAVE BEEN REFERRED TO A SPECIALIST, IT MAY TAKE 1-2 WEEKS TO SCHEDULE/PROCESS THE REFERRAL. IF YOU HAVE NOT HEARD FROM US/SPECIALIST IN TWO WEEKS, PLEASE GIVE [place] A CALL AT [phone].   THE PATIENT IS ENCOURAGED TO PRACTICE SOCIAL DISTANCING DUE TO THE COVID-19 PANDEMIC.

## 2022-11-04 NOTE — Patient Instructions (Signed)
Hypertension, Adult ?Hypertension is another name for high blood pressure. High blood pressure forces your heart to work harder to pump blood. This can cause problems over time. ?There are two numbers in a blood pressure reading. There is a top number (systolic) over a bottom number (diastolic). It is best to have a blood pressure that is below 120/80. ?What are the causes? ?The cause of this condition is not known. Some other conditions can lead to high blood pressure. ?What increases the risk? ?Some lifestyle factors can make you more likely to develop high blood pressure: ?Smoking. ?Not getting enough exercise or physical activity. ?Being overweight. ?Having too much fat, sugar, calories, or salt (sodium) in your diet. ?Drinking too much alcohol. ?Other risk factors include: ?Having any of these conditions: ?Heart disease. ?Diabetes. ?High cholesterol. ?Kidney disease. ?Obstructive sleep apnea. ?Having a family history of high blood pressure and high cholesterol. ?Age. The risk increases with age. ?Stress. ?What are the signs or symptoms? ?High blood pressure may not cause symptoms. Very high blood pressure (hypertensive crisis) may cause: ?Headache. ?Fast or uneven heartbeats (palpitations). ?Shortness of breath. ?Nosebleed. ?Vomiting or feeling like you may vomit (nauseous). ?Changes in how you see. ?Very bad chest pain. ?Feeling dizzy. ?Seizures. ?How is this treated? ?This condition is treated by making healthy lifestyle changes, such as: ?Eating healthy foods. ?Exercising more. ?Drinking less alcohol. ?Your doctor may prescribe medicine if lifestyle changes do not help enough and if: ?Your top number is above 130. ?Your bottom number is above 80. ?Your personal target blood pressure may vary. ?Follow these instructions at home: ?Eating and drinking ? ?If told, follow the DASH eating plan. To follow this plan: ?Fill one half of your plate at each meal with fruits and vegetables. ?Fill one fourth of your plate  at each meal with whole grains. Whole grains include whole-wheat pasta, brown rice, and whole-grain bread. ?Eat or drink low-fat dairy products, such as skim milk or low-fat yogurt. ?Fill one fourth of your plate at each meal with low-fat (lean) proteins. Low-fat proteins include fish, chicken without skin, eggs, beans, and tofu. ?Avoid fatty meat, cured and processed meat, or chicken with skin. ?Avoid pre-made or processed food. ?Limit the amount of salt in your diet to less than 1,500 mg each day. ?Do not drink alcohol if: ?Your doctor tells you not to drink. ?You are pregnant, may be pregnant, or are planning to become pregnant. ?If you drink alcohol: ?Limit how much you have to: ?0-1 drink a day for women. ?0-2 drinks a day for men. ?Know how much alcohol is in your drink. In the [place] one drink equals one 12 oz bottle of beer (355 mL), one 5 oz glass of wine (148 mL), or one 1? oz glass of hard liquor (44 mL). ?Lifestyle ? ?Work with your doctor to stay at a healthy weight or to lose weight. Ask your doctor what the best weight is for you. ?Get at least 30 minutes of exercise that causes your heart to beat faster (aerobic exercise) most days of the week. This may include walking, swimming, or biking. ?Get at least 30 minutes of exercise that strengthens your muscles (resistance exercise) at least 3 days a week. This may include lifting weights or doing Pilates. ?Do not smoke or use any products that contain nicotine or tobacco. If you need help quitting, ask your doctor. ?Check your blood pressure at home as told by your doctor. ?Keep all follow-up visits. ?Medicines ?Take over-the-counter and prescription medicines   only as told by your doctor. Follow directions carefully. ?Do not skip doses of blood pressure medicine. The medicine does not work as well if you skip doses. Skipping doses also puts you at risk for problems. ?Ask your doctor about side effects or reactions to medicines that you should watch  for. ?Contact a doctor if: ?You think you are having a reaction to the medicine you are taking. ?You have headaches that keep coming back. ?You feel dizzy. ?You have swelling in your ankles. ?You have trouble with your vision. ?Get help right away if: ?You get a very bad headache. ?You start to feel mixed up (confused). ?You feel weak or numb. ?You feel faint. ?You have very bad pain in your: ?Chest. ?Belly (abdomen). ?You vomit more than once. ?You have trouble breathing. ?These symptoms may be an emergency. Get help right away. Call 911. ?Do not wait to see if the symptoms will go away. ?Do not drive yourself to the hospital. ?Summary ?Hypertension is another name for high blood pressure. ?High blood pressure forces your heart to work harder to pump blood. ?For most people, a normal blood pressure is less than 120/80. ?Making healthy choices can help lower blood pressure. If your blood pressure does not get lower with healthy choices, you may need to take medicine. ?This information is not intended to replace advice given to you by your health care provider. Make sure you discuss any questions you have with your health care provider. ?Document Revised: 03/29/2021 Document Reviewed: 03/29/2021 ?Elsevier Patient Education ? 2023 Elsevier Inc. ? ?

## 2022-11-04 NOTE — Assessment & Plan Note (Signed)
Blood pressure is well controlled, continue current medications.  

## 2022-11-04 NOTE — Assessment & Plan Note (Signed)
Diet controlled, continue current regimen with increasing physical activity and limit intake of sugary foods and drinks.

## 2022-11-04 NOTE — Assessment & Plan Note (Signed)
Improved after cochicine, he was also drinking increased amounts of water

## 2022-11-05 LAB — BASIC METABOLIC PANEL
BUN/Creatinine Ratio: 12 (ref 9–20)
BUN: 11 mg/dL (ref 6–24)
CO2: 21 mmol/L (ref 20–29)
Calcium: 9.4 mg/dL (ref 8.7–10.2)
Chloride: 105 mmol/L (ref 96–106)
Creatinine, Ser: 0.91 mg/dL (ref 0.76–1.27)
Glucose: 85 mg/dL (ref 70–99)
Potassium: 4.1 mmol/L (ref 3.5–5.2)
Sodium: 140 mmol/L (ref 134–144)
eGFR: 107 mL/min/{1.73_m2} (ref >59)

## 2022-11-05 LAB — HEMOGLOBIN A1C
Est. average glucose Bld gHb Est-mCnc: 120 mg/dL
Hgb A1c MFr Bld: 5.8 % — ABNORMAL HIGH (ref 4.8–5.6)

## 2022-11-05 LAB — URIC ACID: Uric Acid: 7.7 mg/dL (ref 3.8–8.4)

## 2023-02-12 ENCOUNTER — Other Ambulatory Visit: Payer: Self-pay

## 2023-02-12 DIAGNOSIS — I10 Essential (primary) hypertension: Secondary | ICD-10-CM

## 2023-02-12 MED ORDER — LOSARTAN POTASSIUM 50 MG PO TABS: ORAL_TABLET | ORAL | 2 refills | Status: DC

## 2023-03-05 ENCOUNTER — Other Ambulatory Visit: Payer: Self-pay

## 2023-03-05 DIAGNOSIS — M109 Gout, unspecified: Secondary | ICD-10-CM

## 2023-03-05 MED ORDER — COLCHICINE 0.6 MG PO TABS: ORAL_TABLET | ORAL | 2 refills | Status: DC

## 2023-03-10 ENCOUNTER — Encounter: Payer: Self-pay | Admitting: Nurse Practitioner

## 2023-03-10 ENCOUNTER — Ambulatory Visit: Payer: 59 | Admitting: Nurse Practitioner

## 2023-03-10 ENCOUNTER — Other Ambulatory Visit: Payer: Self-pay | Admitting: Nurse Practitioner

## 2023-03-10 VITALS — BP 124/80 | HR 67 | Temp 98.3°F | Ht 66.0 in | Wt 232.4 lb

## 2023-03-10 DIAGNOSIS — M10072 Idiopathic gout, left ankle and foot: Secondary | ICD-10-CM

## 2023-03-10 DIAGNOSIS — R7309 Other abnormal glucose: Secondary | ICD-10-CM

## 2023-03-10 DIAGNOSIS — Z6837 Body mass index (BMI) 37.0-37.9, adult: Secondary | ICD-10-CM

## 2023-03-10 DIAGNOSIS — I10 Essential (primary) hypertension: Secondary | ICD-10-CM | POA: Diagnosis not present

## 2023-03-10 DIAGNOSIS — Z2821 Immunization not carried out because of patient refusal: Secondary | ICD-10-CM

## 2023-03-10 NOTE — Assessment & Plan Note (Signed)
Blood pressure is well controlled, continue current medications.

## 2023-03-10 NOTE — Assessment & Plan Note (Signed)
HgbA1c is stable. Continue focusing on healthy diet low in sugar and starches

## 2023-03-10 NOTE — Assessment & Plan Note (Signed)
Improving and no need to add a medication. Continue colchicine as needed. Encouraged to drink tart cherry juice to help prevent flares

## 2023-03-10 NOTE — Progress Notes (Signed)
Madelaine Bhat, CMA,acting as a Neurosurgeon for Arnette Felts, FNP.,have documented all relevant documentation on the behalf of Arnette Felts, FNP,as directed by  Arnette Felts, FNP while in the presence of Arnette Felts, FNP.  Subjective:  Patient ID: Steven Leon , male    DOB: 10-09-1980 , 42 y.o.   MRN: [id number]  Chief Complaint  Patient presents with   Hypertension    HPI  Patient presents today for a pre DM and BP follow up, patient reports compliance with medications. Patient denies any chest pain, SOB, or headaches. Patient did report he had a gout flare to his left foot which is different over the weekend but he is feeling much better - he took colchicine, continues to be tender and no limited movement. Patient has no concerns today.   Wt Readings from Last 3 Encounters: 03/10/23 : 232 lb 6.4 oz (105.4 kg) 11/04/22 : 234 lb 6.4 oz (106.3 kg) 07/04/22 : 236 lb (107 kg)       Past Medical History:  Diagnosis Date   Essential hypertension 04/04/2016   Gout    Hypertension      Family History  Problem Relation Age of Onset   Diabetes Mother    Seizures Father    Cancer Maternal Grandmother    Diabetes Maternal Grandmother    Cancer - Prostate Maternal Grandfather    Diabetes Paternal Grandmother    Hypertension Paternal Grandmother    Diabetes Maternal Aunt    Diabetes Maternal Uncle      Current Outpatient Medications:    cetirizine (ZYRTEC) 10 MG tablet, Take 10 mg by mouth daily as needed for allergies., Disp: , Rfl:    colchicine 0.6 MG tablet, Take 1 tablet by mouth as needed BID, Disp: 30 tablet, Rfl: 2   losartan (COZAAR) 50 MG tablet, TAKE 1 TABLET(50 MG) BY MOUTH DAILY, Disp: 90 tablet, Rfl: 2   Magnesium Gluconate 250 MG TABS, Take 1 tablet (250 mg total) by mouth at bedtime as needed. (Patient not taking: Reported on 03/10/2023), Disp: 30 tablet, Rfl: 5   No Known Allergies   Review of Systems  Constitutional: Negative.   HENT: Negative.    Eyes:  Negative.   Respiratory: Negative.    Cardiovascular: Negative.   Gastrointestinal: Negative.   Musculoskeletal: Negative.   Skin: Negative.   Psychiatric/Behavioral: Negative.       Today's Vitals   03/10/23 0930  BP: 124/80  Pulse: 67  Temp: 98.3 F (36.8 C)  Weight: 232 lb 6.4 oz (105.4 kg)  Height: 5\' 6"  (1.676 m)  PainSc: 0-No pain   Body mass index is 37.51 kg/m.  Wt Readings from Last 3 Encounters:  03/10/23 232 lb 6.4 oz (105.4 kg)  11/04/22 234 lb 6.4 oz (106.3 kg)  07/04/22 236 lb (107 kg)     Objective:  Physical Exam Vitals reviewed.  Constitutional:      General: He is not in acute distress.    Appearance: Normal appearance. He is obese.  Cardiovascular:     Pulses: Normal pulses.     Heart sounds: Normal heart sounds. No murmur heard. Pulmonary:     Effort: Pulmonary effort is normal. No respiratory distress.     Breath sounds: Normal breath sounds.  Skin:    General: Skin is warm and dry.  Neurological:     General: No focal deficit present.     Mental Status: He is alert and oriented to person, place, and time.  Cranial Nerves: No cranial nerve deficit.     Motor: No weakness.  Psychiatric:        Mood and Affect: Mood normal.        Behavior: Behavior normal.        Thought Content: Thought content normal.        Judgment: Judgment normal.         Assessment And Plan:  Essential hypertension Assessment & Plan: Blood pressure is well controlled, continue current medications  Orders: -     Basic metabolic panel  Abnormal glucose Assessment & Plan: HgbA1c is stable. Continue focusing on healthy diet low in sugar and starches.   Orders: -     Hemoglobin A1c  Acute idiopathic gout of left ankle Assessment & Plan: Improving and no need to add a medication. Continue colchicine as needed. Encouraged to drink tart cherry juice to help prevent flares   Influenza vaccination declined  COVID-19 vaccination declined  Class 2 severe  obesity due to excess calories with serious comorbidity and body mass index (BMI) of 37.0 to 37.9 in adult [facility] Assessment & Plan: He has lost approximately 2 lbs since his last visit. Continue exercising regularly.   Orders: -     Vitamin B12    Return for keep next appt as scheduled .  Patient was given opportunity to ask questions. Patient verbalized understanding of the plan and was able to repeat key elements of the plan. All questions were answered to their satisfaction.    Jeanell Sparrow, FNP, have reviewed all documentation for this visit. The documentation on 03/10/23 for the exam, diagnosis, procedures, and orders are all accurate and complete.   IF YOU HAVE BEEN REFERRED TO A SPECIALIST, IT MAY TAKE 1-2 WEEKS TO SCHEDULE/PROCESS THE REFERRAL. IF YOU HAVE NOT HEARD FROM US/SPECIALIST IN TWO WEEKS, PLEASE GIVE [place] A CALL AT [phone].

## 2023-03-10 NOTE — Assessment & Plan Note (Signed)
He has lost approximately 2 lbs since his last visit. Continue exercising regularly.

## 2023-03-11 LAB — BASIC METABOLIC PANEL
BUN/Creatinine Ratio: 11 (ref 9–20)
BUN: 12 mg/dL (ref 6–24)
CO2: 23 mmol/L (ref 20–29)
Calcium: 9.9 mg/dL (ref 8.7–10.2)
Chloride: 100 mmol/L (ref 96–106)
Creatinine, Ser: 1.12 mg/dL (ref 0.76–1.27)
Glucose: 85 mg/dL (ref 70–99)
Potassium: 4.6 mmol/L (ref 3.5–5.2)
Sodium: 141 mmol/L (ref 134–144)
eGFR: 84 mL/min/{1.73_m2} (ref >59)

## 2023-03-11 LAB — HEMOGLOBIN A1C
Est. average glucose Bld gHb Est-mCnc: 120 mg/dL
Hgb A1c MFr Bld: 5.8 % — ABNORMAL HIGH (ref 4.8–5.6)

## 2023-03-11 LAB — VITAMIN B12: Vitamin B-12: 609 pg/mL (ref 232–1245)

## 2023-07-09 ENCOUNTER — Encounter: Payer: Self-pay | Admitting: Nurse Practitioner

## 2023-07-09 ENCOUNTER — Ambulatory Visit: Payer: 59 | Admitting: Nurse Practitioner

## 2023-07-09 VITALS — BP 120/74 | HR 60 | Temp 97.9°F | Ht 66.0 in | Wt 231.2 lb

## 2023-07-09 DIAGNOSIS — R7309 Other abnormal glucose: Secondary | ICD-10-CM | POA: Diagnosis not present

## 2023-07-09 DIAGNOSIS — Z2821 Immunization not carried out because of patient refusal: Secondary | ICD-10-CM

## 2023-07-09 DIAGNOSIS — Z0000 Encounter for general adult medical examination without abnormal findings: Secondary | ICD-10-CM

## 2023-07-09 DIAGNOSIS — E66812 Obesity, class 2: Secondary | ICD-10-CM

## 2023-07-09 DIAGNOSIS — Z13220 Encounter for screening for lipoid disorders: Secondary | ICD-10-CM

## 2023-07-09 DIAGNOSIS — I10 Essential (primary) hypertension: Secondary | ICD-10-CM | POA: Diagnosis not present

## 2023-07-09 DIAGNOSIS — Z125 Encounter for screening for malignant neoplasm of prostate: Secondary | ICD-10-CM

## 2023-07-09 DIAGNOSIS — Z6837 Body mass index (BMI) 37.0-37.9, adult: Secondary | ICD-10-CM

## 2023-07-09 DIAGNOSIS — Z79899 Other long term (current) drug therapy: Secondary | ICD-10-CM

## 2023-07-09 NOTE — Patient Instructions (Signed)
 You can take magnesium  and/or ashwaghanda to help you relax and sleep.   Health Maintenance  Topic Date Due   COVID-19 Vaccine (3 - Pfizer risk series) 07/25/2023*   Flu Shot  09/22/2023*   DTaP/Tdap/Td vaccine (2 - Td or Tdap) 03/12/2026   Hepatitis C Screening  Completed   HIV Screening  Completed   HPV Vaccine  Aged Out  *Topic was postponed. The date shown is not the original due date.

## 2023-07-09 NOTE — Progress Notes (Signed)
Madelaine Bhat, CMA,acting as a Neurosurgeon for Arnette Felts, FNP.,have documented all relevant documentation on the behalf of Arnette Felts, FNP,as directed by  Arnette Felts, FNP while in the presence of Arnette Felts, FNP.  Subjective:   Patient ID: Steven Leon , male    DOB: 23-Sep-1980 , 43 y.o.   MRN: [id number]  Chief Complaint  Patient presents with   Annual Exam    HPI  Patient presents today for HM, Patient reports compliance with medication. Patient denies any chest pain, SOB, or headaches. Patient has no concerns today.      Past Medical History:  Diagnosis Date   Essential hypertension 04/04/2016   Gout    Hypertension      Family History  Problem Relation Age of Onset   Diabetes Mother    Seizures Father    Cancer Maternal Grandmother    Diabetes Maternal Grandmother    Cancer - Prostate Maternal Grandfather    Diabetes Paternal Grandmother    Hypertension Paternal Grandmother    Diabetes Maternal Aunt    Diabetes Maternal Uncle      Current Outpatient Medications:    cetirizine (ZYRTEC) 10 MG tablet, Take 10 mg by mouth daily as needed for allergies., Disp: , Rfl:    colchicine 0.6 MG tablet, Take 1 tablet by mouth as needed BID, Disp: 30 tablet, Rfl: 2   losartan (COZAAR) 50 MG tablet, TAKE 1 TABLET(50 MG) BY MOUTH DAILY, Disp: 90 tablet, Rfl: 2   Magnesium Gluconate 250 MG TABS, Take 1 tablet (250 mg total) by mouth at bedtime as needed. (Patient not taking: Reported on 07/09/2023), Disp: 30 tablet, Rfl: 5   No Known Allergies   Men's preventive visit. Patient Health Questionnaire (PHQ-2) is  Flowsheet Row Office Visit from 07/09/2023 in [facility]  PHQ-2 Total Score 0     Patient is on a regular diet; has stopped eating meat. He has seen some inches fall but not weight. Exercise: minimal. Marital status: Married. Relevant history for alcohol use is:  Social History   Substance and Sexual Activity  Alcohol Use Yes    Alcohol/week: 1.0 standard drink of alcohol   Types: 1 Cans of beer per week   Relevant history for tobacco use is:  Social History   Tobacco Use  Smoking Status Never  Smokeless Tobacco Never  .   Review of Systems  Constitutional: Negative.   Eyes: Negative.   Respiratory: Negative.    Cardiovascular: Negative.   Gastrointestinal: Negative.   Endocrine: Negative.   Genitourinary: Negative.   Musculoskeletal: Negative.   Skin: Negative.   Allergic/Immunologic: Negative.   Neurological: Negative.   Hematological: Negative.   Psychiatric/Behavioral: Negative.         Insomnia     Today's Vitals   07/09/23 1110  BP: 120/74  Pulse: 60  Temp: 97.9 F (36.6 C)  TempSrc: Oral  Weight: 231 lb 3.2 oz (104.9 kg)  Height: 5\' 6"  (1.676 m)  PainSc: 0-No pain   Body mass index is 37.32 kg/m.  Wt Readings from Last 3 Encounters:  07/09/23 231 lb 3.2 oz (104.9 kg)  03/10/23 232 lb 6.4 oz (105.4 kg)  11/04/22 234 lb 6.4 oz (106.3 kg)    Objective:  Physical Exam Vitals reviewed.  Constitutional:      General: He is not in acute distress.    Appearance: Normal appearance. He is obese.  HENT:     Head: Normocephalic and atraumatic.  Right Ear: Tympanic membrane, ear canal and external ear normal. There is no impacted cerumen.     Left Ear: Tympanic membrane, ear canal and external ear normal. There is no impacted cerumen.     Nose: Nose normal.     Mouth/Throat:     Mouth: Mucous membranes are moist.  Cardiovascular:     Rate and Rhythm: Normal rate and regular rhythm.     Pulses: Normal pulses.     Heart sounds: Normal heart sounds. No murmur heard. Pulmonary:     Effort: Pulmonary effort is normal. No respiratory distress.     Breath sounds: Normal breath sounds. No wheezing.  Abdominal:     General: Abdomen is flat. Bowel sounds are normal. There is no distension.     Palpations: Abdomen is soft.  Genitourinary:    Comments: Deferred Musculoskeletal:         General: No swelling. Normal range of motion.     Cervical back: Normal range of motion and neck supple.  Skin:    General: Skin is warm and dry.     Capillary Refill: Capillary refill takes less than 2 seconds.  Neurological:     General: No focal deficit present.     Mental Status: He is alert and oriented to person, place, and time.     Cranial Nerves: No cranial nerve deficit.     Motor: No weakness.  Psychiatric:        Mood and Affect: Mood normal.        Behavior: Behavior normal.        Thought Content: Thought content normal.        Judgment: Judgment normal.        07/09/2023   11:13 AM 03/10/2023    9:29 AM 11/04/2022   10:12 AM 07/04/2022   11:14 AM 04/29/2022   12:13 PM  Depression screen PHQ 2/9  Decreased Interest 0 0 0 0 0  Down, Depressed, Hopeless 0 0 0 0 0  PHQ - 2 Score 0 0 0 0 0  Altered sleeping 0 0     Tired, decreased energy 1 0     Change in appetite 1 0     Feeling bad or failure about yourself  0 0     Trouble concentrating 0 0     Moving slowly or fidgety/restless 0 0     Suicidal thoughts 0 0     PHQ-9 Score 2 0     Difficult doing work/chores Not difficult at all Not difficult at all          Assessment And Plan:    Encounter for annual health examination Assessment & Plan: Behavior modifications discussed and diet history reviewed.   Pt will continue to exercise regularly and modify diet with low GI, plant based foods and decrease intake of processed foods.  Recommend intake of daily multivitamin, Vitamin D, and calcium.  Recommend colonoscopy for preventive screenings, as well as recommend immunizations that include influenza (declines), TDAP    Essential hypertension Assessment & Plan: Blood pressure is well controlled, continue current medications  Orders: -     EKG 12-Lead -     POCT URINALYSIS DIP (CLINITEK) -     Microalbumin / creatinine urine ratio -     CMP14+EGFR  Abnormal glucose Assessment & Plan: HgbA1c is stable.  Continue focusing on healthy diet low in sugar and starches.   Orders: -     Hemoglobin A1c  Influenza vaccination  declined Assessment & Plan: Patient declined influenza vaccination at this time. Patient is aware that influenza vaccine prevents illness in 70% of healthy people, and reduces hospitalizations to 30-70% in elderly. This vaccine is recommended annually. Education has been provided regarding the importance of this vaccine but patient still declined. Advised may receive this vaccine at local pharmacy or Health Dept.or vaccine clinic. Aware to provide a copy of the vaccination record if obtained from local pharmacy or Health Dept.  Pt is willing to accept risk associated with refusing vaccination.    COVID-19 vaccination declined Assessment & Plan: Declines covid 19 vaccine. Discussed risk of covid 55 and if he changes her mind about the vaccine to call the office. Education has been provided regarding the importance of this vaccine but patient still declined. Advised may receive this vaccine at local pharmacy or Health Dept.or vaccine clinic. Aware to provide a copy of the vaccination record if obtained from local pharmacy or Health Dept.  Encouraged to take multivitamin, vitamin d, vitamin c and zinc to increase immune system. Aware can call office if would like to have vaccine here at office. Verbalized acceptance and understanding.     Class 2 severe obesity due to excess calories with serious comorbidity and body mass index (BMI) of 37.0 to 37.9 in adult [facility]) Assessment & Plan: he is encouraged to strive for BMI less than 30 to decrease cardiac risk. Continue to aim for at least 150 minutes of exercise per week.     Encounter for prostate cancer screening -     PSA  Other long term (current) drug therapy -     CBC with Differential/Platelet -     TSH  Encounter for screening for lipid disorder -     Lipid panel     Return for 1 year physical, 6 month bp  check. Patient was given opportunity to ask questions. Patient verbalized understanding of the plan and was able to repeat key elements of the plan. All questions were answered to their satisfaction.   Arnette Felts, FNP  I, Arnette Felts, FNP, have reviewed all documentation for this visit. The documentation on 07/09/23 for the exam, diagnosis, procedures, and orders are all accurate and complete.

## 2023-07-11 LAB — CBC WITH DIFFERENTIAL/PLATELET
Basophils Absolute: 0 10*3/uL (ref 0.0–0.2)
Basos: 1 %
EOS (ABSOLUTE): 0.1 10*3/uL (ref 0.0–0.4)
Eos: 4 %
Hematocrit: 45.3 % (ref 37.5–51.0)
Hemoglobin: 14.7 g/dL (ref 13.0–17.7)
Immature Grans (Abs): 0 10*3/uL (ref 0.0–0.1)
Immature Granulocytes: 0 %
Lymphocytes Absolute: 1.6 10*3/uL (ref 0.7–3.1)
Lymphs: 42 %
MCH: 26.9 pg (ref 26.6–33.0)
MCHC: 32.5 g/dL (ref 31.5–35.7)
MCV: 83 fL (ref 79–97)
Monocytes Absolute: 0.3 10*3/uL (ref 0.1–0.9)
Monocytes: 7 %
Neutrophils Absolute: 1.7 10*3/uL (ref 1.4–7.0)
Neutrophils: 46 %
Platelets: 245 10*3/uL (ref 150–450)
RBC: 5.46 x10E6/uL (ref 4.14–5.80)
RDW: 14.6 % (ref 11.6–15.4)
WBC: 3.7 10*3/uL (ref 3.4–10.8)

## 2023-07-11 LAB — CMP14+EGFR
ALT: 29 [IU]/L (ref 0–44)
AST: 21 [IU]/L (ref 0–40)
Albumin: 4.4 g/dL (ref 4.1–5.1)
Alkaline Phosphatase: 148 [IU]/L — ABNORMAL HIGH (ref 44–121)
BUN/Creatinine Ratio: 11 (ref 9–20)
BUN: 10 mg/dL (ref 6–24)
Bilirubin Total: 0.4 mg/dL (ref 0.0–1.2)
CO2: 24 mmol/L (ref 20–29)
Calcium: 9.5 mg/dL (ref 8.7–10.2)
Chloride: 102 mmol/L (ref 96–106)
Creatinine, Ser: 0.9 mg/dL (ref 0.76–1.27)
Globulin, Total: 3.2 g/dL (ref 1.5–4.5)
Glucose: 83 mg/dL (ref 70–99)
Potassium: 4.4 mmol/L (ref 3.5–5.2)
Sodium: 141 mmol/L (ref 134–144)
Total Protein: 7.6 g/dL (ref 6.0–8.5)
eGFR: 109 mL/min/{1.73_m2} (ref >59)

## 2023-07-11 LAB — HEMOGLOBIN A1C
Est. average glucose Bld gHb Est-mCnc: 123 mg/dL
Hgb A1c MFr Bld: 5.9 % — ABNORMAL HIGH (ref 4.8–5.6)

## 2023-07-11 LAB — LIPID PANEL
Chol/HDL Ratio: 3 {ratio} (ref 0.0–5.0)
Cholesterol, Total: 169 mg/dL (ref 100–199)
HDL: 57 mg/dL (ref >39)
LDL Chol Calc (NIH): 82 mg/dL (ref 0–99)
Triglycerides: 179 mg/dL — ABNORMAL HIGH (ref 0–149)
VLDL Cholesterol Cal: 30 mg/dL (ref 5–40)

## 2023-07-11 LAB — PSA: Prostate Specific Ag, Serum: 0.9 ng/mL (ref 0.0–4.0)

## 2023-07-11 LAB — MICROALBUMIN / CREATININE URINE RATIO
Creatinine, Urine: 141.8 mg/dL
Microalb/Creat Ratio: 4 mg/g{creat} (ref 0–29)
Microalbumin, Urine: 6.1 ug/mL

## 2023-07-11 LAB — TSH: TSH: 1.56 u[IU]/mL (ref 0.450–4.500)

## 2023-07-22 ENCOUNTER — Encounter: Payer: Self-pay | Admitting: Nurse Practitioner

## 2023-07-22 LAB — POCT URINALYSIS DIP (CLINITEK)
Bilirubin, UA: NEGATIVE
Blood, UA: NEGATIVE
Glucose, UA: NEGATIVE mg/dL
Ketones, POC UA: NEGATIVE mg/dL
Leukocytes, UA: NEGATIVE
Nitrite, UA: NEGATIVE
POC PROTEIN,UA: NEGATIVE
Spec Grav, UA: 1.01 (ref 1.010–1.025)
Urobilinogen, UA: 0.2 U/dL
pH, UA: 7 (ref 5.0–8.0)

## 2023-07-22 NOTE — Assessment & Plan Note (Signed)
he is encouraged to strive for BMI less than 30 to decrease cardiac risk. Continue to aim for at least 150 minutes of exercise per week.

## 2023-07-22 NOTE — Assessment & Plan Note (Signed)

## 2023-07-22 NOTE — Assessment & Plan Note (Signed)
Behavior modifications discussed and diet history reviewed.   Pt will continue to exercise regularly and modify diet with low GI, plant based foods and decrease intake of processed foods.  Recommend intake of daily multivitamin, Vitamin D, and calcium.  Recommend colonoscopy for preventive screenings, as well as recommend immunizations that include influenza (declines), TDAP

## 2023-07-22 NOTE — Assessment & Plan Note (Signed)
Declines covid 19 vaccine. Discussed risk of covid 63 and if he changes her mind about the vaccine to call the office. Education has been provided regarding the importance of this vaccine but patient still declined. Advised may receive this vaccine at local pharmacy or Health Dept.or vaccine clinic. Aware to provide a copy of the vaccination record if obtained from local pharmacy or Health Dept.  Encouraged to take multivitamin, vitamin d, vitamin c and zinc to increase immune system. Aware can call office if would like to have vaccine here at office. Verbalized acceptance and understanding.

## 2023-07-22 NOTE — Assessment & Plan Note (Signed)
HgbA1c is stable. Continue focusing on healthy diet low in sugar and starches.

## 2023-07-22 NOTE — Assessment & Plan Note (Signed)
Blood pressure is well controlled, continue current medications.

## 2024-01-06 ENCOUNTER — Encounter: Payer: Self-pay | Admitting: Nurse Practitioner

## 2024-01-06 ENCOUNTER — Ambulatory Visit: Payer: 59 | Admitting: Nurse Practitioner

## 2024-01-06 VITALS — BP 130/70 | HR 69 | Temp 98.7°F | Ht 65.0 in | Wt 224.0 lb

## 2024-01-06 DIAGNOSIS — E66812 Obesity, class 2: Secondary | ICD-10-CM

## 2024-01-06 DIAGNOSIS — R7309 Other abnormal glucose: Secondary | ICD-10-CM | POA: Diagnosis not present

## 2024-01-06 DIAGNOSIS — Z8739 Personal history of other diseases of the musculoskeletal system and connective tissue: Secondary | ICD-10-CM

## 2024-01-06 DIAGNOSIS — I10 Essential (primary) hypertension: Secondary | ICD-10-CM | POA: Diagnosis not present

## 2024-01-06 DIAGNOSIS — E781 Pure hyperglyceridemia: Secondary | ICD-10-CM | POA: Diagnosis not present

## 2024-01-06 DIAGNOSIS — Z139 Encounter for screening, unspecified: Secondary | ICD-10-CM

## 2024-01-06 DIAGNOSIS — Z6837 Body mass index (BMI) 37.0-37.9, adult: Secondary | ICD-10-CM

## 2024-01-06 DIAGNOSIS — Z2821 Immunization not carried out because of patient refusal: Secondary | ICD-10-CM

## 2024-01-06 NOTE — Progress Notes (Signed)
 [name], CMA,acting as a Neurosurgeon for Gaines Ada, FNP.,have documented all relevant documentation on the behalf of Gaines Ada, FNP,as directed by  Gaines Ada, FNP while in the presence of Gaines Ada, FNP.  Subjective:  Patient ID: Steven Leon , male    DOB: 1980-12-28 , 43 y.o.   MRN: [id number]  Chief Complaint  Patient presents with   Hypertension    Patient presents today for a bp and pre dm follow up, Patient reports compliance with medication. Patient denies any chest pain, SOB, or headaches. Patient has no concerns today.     HPI  HPI  Discussed the use of AI scribe software for clinical note transcription with the patient, who gave verbal consent to proceed.  History of Present Illness Steven Leon is a 43 year old male with hypertension and gout who presents for a blood pressure follow-up and A1c recheck. He is accompanied by his wife.  He has no issues with his blood pressure or medication, with a recent reading of 130/80 mmHg. He has not seen any other healthcare providers since his last visit.  He experienced a gout flare a couple of weeks ago, managed by taking medication in the evening and the following morning. Symptoms included discomfort in his knee and right toe, which eased by the next night. He attributes the flare to increased consumption of juices and sodas and insufficient water intake over the Fourth of July weekend.  He has been conscious about his eating habits, having eliminated meat from his diet since September 26th of the previous year. He has lost weight, going from 231 pounds in January to 224 pounds currently. However, he does not engage in consistent exercise, describing his activity as sporadic.  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 The knee was drained of fluid three to four years ago, but he did not undergo surgery.  He is unsure about his hepatitis B  vaccination status.  No new issues with his blood pressure or medication. No pain in his feet or ankles, but there is persistent minor swelling in his injured knee.   Past Medical History:  Diagnosis Date   Essential hypertension 04/04/2016   Gout    Hypertension      Family History  Problem Relation Age of Onset   Diabetes Mother    Seizures Father    Cancer Maternal Grandmother    Diabetes Maternal Grandmother    Cancer - Prostate Maternal Grandfather    Diabetes Paternal Grandmother    Hypertension Paternal Grandmother    Diabetes Maternal Aunt    Diabetes Maternal Uncle      Current Outpatient Medications:    cetirizine (ZYRTEC) 10 MG tablet, Take 10 mg by mouth daily as needed for allergies., Disp: , Rfl:    colchicine  0.6 MG tablet, Take 1 tablet by mouth as needed BID, Disp: 30 tablet, Rfl: 2   losartan  (COZAAR ) 50 MG tablet, TAKE 1 TABLET(50 MG) BY MOUTH DAILY, Disp: 90 tablet, Rfl: 2   Magnesium  Gluconate 250 MG TABS, Take 1 tablet (250 mg total) by mouth at bedtime as needed. (Patient not taking: Reported on 01/06/2024), Disp: 30 tablet, Rfl: 5   No Known Allergies   Review of Systems  Constitutional: Negative.   HENT: Negative.    Eyes: Negative.   Respiratory: Negative.    Cardiovascular: Negative.   Gastrointestinal: Negative.   Musculoskeletal: Negative.  Skin: Negative.   Psychiatric/Behavioral: Negative.       Today's Vitals   01/06/24 1021 01/06/24 1043  BP: 130/80 130/70  Pulse: 69   Temp: 98.7 F (37.1 C)   TempSrc: Oral   Weight: 224 lb (101.6 kg)   Height: 5' 5 (1.651 m)   PainSc: 0-No pain    Body mass index is 37.28 kg/m.  Wt Readings from Last 3 Encounters:  01/06/24 224 lb (101.6 kg)  07/09/23 231 lb 3.2 oz (104.9 kg)  03/10/23 232 lb 6.4 oz (105.4 kg)     Objective:  Physical Exam Vitals and nursing note reviewed.  Constitutional:      General: He is not in acute distress.    Appearance: Normal appearance. He is obese.   Cardiovascular:     Pulses: Normal pulses.     Heart sounds: Normal heart sounds. No murmur heard. Pulmonary:     Effort: Pulmonary effort is normal. No respiratory distress.     Breath sounds: Normal breath sounds. No wheezing.  Skin:    General: Skin is warm and dry.  Neurological:     General: No focal deficit present.     Mental Status: He is alert and oriented to person, place, and time.     Cranial Nerves: No cranial nerve deficit.     Motor: No weakness.  Psychiatric:        Mood and Affect: Mood normal.        Behavior: Behavior normal.        Thought Content: Thought content normal.        Judgment: Judgment normal.         Assessment And Plan:  Essential hypertension Assessment & Plan: Blood pressure is well controlled, continue current medications.  Blood pressure 130/80 mmHg, borderline. - Recheck blood pressure during the visit.  Orders: -     BMP8+eGFR  Abnormal glucose Assessment & Plan: Previous A1c 5.9%. Aiming to reduce to 5.6% through diet and weight management.   Continue focusing on healthy diet low in sugar and starches.  - Recheck A1c with labs.  Orders: -     Hemoglobin A1c  High triglycerides Assessment & Plan: Elevated triglycerides previously. Advised dietary modifications. - Check triglyceride levels with labs.  Orders: -     Lipid panel  COVID-19 vaccination declined Assessment & Plan: Declines covid 19 vaccine. Discussed risk of covid 27 and if he changes her mind about the vaccine to call the office. Education has been provided regarding the importance of this vaccine but patient still declined. Advised may receive this vaccine at local pharmacy or Health Dept.or vaccine clinic. Aware to provide a copy of the vaccination record if obtained from local pharmacy or Health Dept.  Encouraged to take multivitamin, vitamin d , vitamin c and zinc to increase immune system. Aware can call office if would like to have vaccine here at office.  Verbalized acceptance and understanding.     Class 2 severe obesity due to excess calories with serious comorbidity and body mass index (BMI) of 37.0 to 37.9 in adult [facility]) Assessment & Plan: Weight reduced to 224 lbs. Sporadic exercise, needs consistency. - Encourage regular exercise routine.   Encounter for screening -     Hepatitis B surface antibody,qualitative  History of gout Assessment & Plan: Recent flare managed with colchicine , symptoms alleviated. Increased sugary beverage intake may have contributed. - Encouraged increased water intake and reduction of sugary beverages.       Return for  keep same next.  15tient was given opportunity to ask questions. Patient verbalized understanding of the plan and was able to repeat key elements of the plan. All questions were answered to their satisfaction.    LILLETTE Gaines Ada, FNP, have reviewed all documentation for this visit. The documentation on 01/18/24 for the exam, diagnosis, procedures, and orders are all accurate and complete.   IF YOU HAVE BEEN REFERRED TO A SPECIALIST, IT MAY TAKE 1-2 WEEKS TO SCHEDULE/PROCESS THE REFERRAL. IF YOU HAVE NOT HEARD FROM US /SPECIALIST IN TWO WEEKS, PLEASE GIVE US  A CALL AT [phone].

## 2024-01-07 LAB — BMP8+EGFR
BUN/Creatinine Ratio: 14 (ref 9–20)
BUN: 14 mg/dL (ref 6–24)
CO2: 21 mmol/L (ref 20–29)
Calcium: 9.8 mg/dL (ref 8.7–10.2)
Chloride: 104 mmol/L (ref 96–106)
Creatinine, Ser: 1.03 mg/dL (ref 0.76–1.27)
Glucose: 92 mg/dL (ref 70–99)
Potassium: 4.7 mmol/L (ref 3.5–5.2)
Sodium: 140 mmol/L (ref 134–144)
eGFR: 92 mL/min/1.73 (ref >59)

## 2024-01-07 LAB — HEMOGLOBIN A1C
Est. average glucose Bld gHb Est-mCnc: 114 mg/dL
Hgb A1c MFr Bld: 5.6 % (ref 4.8–5.6)

## 2024-01-07 LAB — LIPID PANEL
Chol/HDL Ratio: 3.1 ratio (ref 0.0–5.0)
Cholesterol, Total: 150 mg/dL (ref 100–199)
HDL: 48 mg/dL (ref >39)
LDL Chol Calc (NIH): 77 mg/dL (ref 0–99)
Triglycerides: 145 mg/dL (ref 0–149)
VLDL Cholesterol Cal: 25 mg/dL (ref 5–40)

## 2024-01-07 LAB — HEPATITIS B SURFACE ANTIBODY,QUALITATIVE: Hep B Surface Ab, Qual: NONREACTIVE

## 2024-01-18 ENCOUNTER — Ambulatory Visit: Payer: Self-pay | Admitting: Nurse Practitioner

## 2024-01-18 DIAGNOSIS — E781 Pure hyperglyceridemia: Secondary | ICD-10-CM | POA: Insufficient documentation

## 2024-01-18 DIAGNOSIS — Z8739 Personal history of other diseases of the musculoskeletal system and connective tissue: Secondary | ICD-10-CM | POA: Insufficient documentation

## 2024-01-18 NOTE — Assessment & Plan Note (Signed)
 Previous A1c 5.9%. Aiming to reduce to 5.6% through diet and weight management.   Continue focusing on healthy diet low in sugar and starches.  - Recheck A1c with labs.

## 2024-01-18 NOTE — Assessment & Plan Note (Signed)

## 2024-01-18 NOTE — Assessment & Plan Note (Signed)
 Elevated triglycerides previously. Advised dietary modifications. - Check triglyceride levels with labs.

## 2024-01-18 NOTE — Assessment & Plan Note (Signed)
 Blood pressure is well controlled, continue current medications.  Blood pressure 130/80 mmHg, borderline. - Recheck blood pressure during the visit.

## 2024-01-18 NOTE — Assessment & Plan Note (Signed)
 Recent flare managed with colchicine , symptoms alleviated. Increased sugary beverage intake may have contributed. - Encouraged increased water intake and reduction of sugary beverages.

## 2024-01-18 NOTE — Assessment & Plan Note (Signed)
 Weight reduced to 224 lbs. Sporadic exercise, needs consistency. - Encourage regular exercise routine.

## 2024-02-03 ENCOUNTER — Other Ambulatory Visit: Payer: Self-pay

## 2024-02-03 DIAGNOSIS — I10 Essential (primary) hypertension: Secondary | ICD-10-CM

## 2024-02-03 MED ORDER — LOSARTAN POTASSIUM 50 MG PO TABS: ORAL_TABLET | ORAL | 2 refills | Status: DC

## 2024-06-09 ENCOUNTER — Other Ambulatory Visit: Payer: Self-pay | Admitting: Family Medicine

## 2024-06-09 DIAGNOSIS — M109 Gout, unspecified: Secondary | ICD-10-CM

## 2024-06-09 MED ORDER — COLCHICINE 0.6 MG PO TABS: ORAL_TABLET | ORAL | 1 refills | Status: DC

## 2024-07-12 ENCOUNTER — Ambulatory Visit: Payer: Self-pay

## 2024-07-12 ENCOUNTER — Encounter: Payer: Self-pay | Admitting: Nurse Practitioner

## 2024-07-12 VITALS — BP 130/78 | HR 64 | Temp 98.8°F | Ht 65.0 in | Wt 229.0 lb

## 2024-07-12 DIAGNOSIS — R0683 Snoring: Secondary | ICD-10-CM

## 2024-07-12 DIAGNOSIS — E6609 Other obesity due to excess calories: Secondary | ICD-10-CM

## 2024-07-12 DIAGNOSIS — Z0000 Encounter for general adult medical examination without abnormal findings: Secondary | ICD-10-CM

## 2024-07-12 DIAGNOSIS — R7309 Other abnormal glucose: Secondary | ICD-10-CM

## 2024-07-12 DIAGNOSIS — Z8739 Personal history of other diseases of the musculoskeletal system and connective tissue: Secondary | ICD-10-CM

## 2024-07-12 DIAGNOSIS — I10 Essential (primary) hypertension: Secondary | ICD-10-CM

## 2024-07-12 DIAGNOSIS — Z2821 Immunization not carried out because of patient refusal: Secondary | ICD-10-CM

## 2024-07-12 DIAGNOSIS — M109 Gout, unspecified: Secondary | ICD-10-CM

## 2024-07-12 DIAGNOSIS — E781 Pure hyperglyceridemia: Secondary | ICD-10-CM

## 2024-07-12 MED ORDER — LOSARTAN POTASSIUM 50 MG PO TABS: ORAL_TABLET | ORAL | 2 refills | Status: AC

## 2024-07-12 MED ORDER — COLCHICINE 0.6 MG PO TABS: ORAL_TABLET | ORAL | 1 refills | Status: AC

## 2024-07-12 NOTE — Addendum Note (Signed)
 Addended by: BERNARDO CARWIN on: 07/12/2024 04:17 PM   Modules accepted: Level of Service

## 2024-07-12 NOTE — Assessment & Plan Note (Signed)
 A uric acid level was done on 07/12/2024.

## 2024-07-12 NOTE — Progress Notes (Signed)
 "   [name], CMA,acting as a scribe for Gaither JONELLE Fischer, DO.,have documented all relevant documentation on the behalf of Gaither JONELLE Fischer, DO,as directed by  Gaither JONELLE Fischer, DO while in the presence of Gaither JONELLE Fischer, DO.  Subjective:   Patient ID: Steven Leon , male    DOB: 12-Sep-1980 , 44 y.o.   MRN: [id number]  Chief Complaint  Patient presents with   Annual Exam    Patient presents today for HM, Patient reports compliance with medication. Patient denies any chest pain, SOB, or headaches. Patient has no concerns today.     HPI  This is a 44 year old African-American male who is here for preventive physical exam.  He does have a past medical history of HYPERTENSION and is on losartan  50 mg once a day, GOUT which he is on colchicine  0.6 mg 1 tablet twice a day with flareups.  He is a vegetarian.  He states he has had 2 flareups of gout over the past year.  He states his gout involves his right toe, elbows and knees when he does have a flareup.  He denies any other concerns today.  He states that his wife states that he does snore at times.  He denies any other problems.     Past Medical History:  Diagnosis Date   Essential hypertension 04/04/2016   Gout    Hypertension      Family History  Problem Relation Age of Onset   Diabetes Mother    Seizures Father    Cancer Maternal Grandmother    Diabetes Maternal Grandmother    Cancer - Prostate Maternal Grandfather    Diabetes Paternal Grandmother    Hypertension Paternal Grandmother    Diabetes Maternal Aunt    Diabetes Maternal Uncle     Current Medications[1]   Allergies[2]   Men's preventive visit. Patient Health Questionnaire (PHQ-2) is  Flowsheet Row Office Visit from 07/12/2024 in [facility]  PHQ-2 Total Score 0  . Patient is on a vegetarian diet. Marital status: Married. Relevant history for alcohol use is:  Social History   Substance and Sexual Activity  Alcohol  Use Yes   Alcohol/week: 1.0 standard drink of alcohol   Types: 1 Cans of beer per week  . Relevant history for tobacco use is: Tobacco Use History[3].   Review of Systems  Constitutional:  Negative for fatigue.       History of snoring  Eyes:  Negative for visual disturbance.       No blurry vision  Respiratory:  Negative for chest tightness, shortness of breath and wheezing.   Cardiovascular:  Negative for chest pain, palpitations and leg swelling.  Gastrointestinal:  Negative for abdominal pain, diarrhea, nausea and vomiting.  Genitourinary:  Negative for difficulty urinating and hematuria.  Musculoskeletal:        H/O gout   Neurological:  Negative for dizziness, numbness and headaches.  Psychiatric/Behavioral:         Denies depression     Today's Vitals   07/12/24 1047  BP: 130/78  Pulse: 64  Temp: 98.8 F (37.1 C)  TempSrc: Oral  Weight: 229 lb (103.9 kg)  Height: 5' 5 (1.651 m)  PainSc: 0-No pain   Body mass index is 38.11 kg/m.  Wt Readings from Last 3 Encounters:  07/12/24 229 lb (103.9 kg)  01/06/24 224 lb (101.6 kg)  07/09/23 231 lb 3.2 oz (104.9 kg)    Objective:  Physical Exam Vitals  reviewed.  Constitutional:      Appearance: Normal appearance. He is obese.     Comments: He has gained 5 pounds in weight since 01/06/2024.  HENT:     Head: Normocephalic and atraumatic.     Right Ear: Tympanic membrane, ear canal and external ear normal.     Left Ear: Tympanic membrane, ear canal and external ear normal.     Nose: Nose normal.     Mouth/Throat:     Mouth: Mucous membranes are moist.     Pharynx: Oropharynx is clear.  Eyes:     Pupils: Pupils are equal, round, and reactive to light.  Neck:     Thyroid: No thyromegaly or thyroid tenderness.  Cardiovascular:     Rate and Rhythm: Normal rate and regular rhythm.     Pulses: Normal pulses.     Heart sounds: Normal heart sounds.  Pulmonary:     Effort: Pulmonary effort is normal.     Breath  sounds: Normal breath sounds.  Abdominal:     General: Abdomen is flat.     Palpations: Abdomen is soft.     Tenderness: There is no abdominal tenderness.  Musculoskeletal:        General: No tenderness.     Cervical back: Normal range of motion and neck supple.     Right lower leg: No edema.     Left lower leg: No edema.  Skin:    General: Skin is warm and dry.  Neurological:     Mental Status: He is alert.  Psychiatric:        Mood and Affect: Mood normal.        Behavior: Behavior normal.         Assessment And Plan:    Encounter for annual health examination Assessment & Plan: The EKG done on 07/12/2024 showed a normal sinus rhythm with a left axis deviation and an anterior fascicular block.  Orders: -     EKG 12-Lead  Essential hypertension Assessment & Plan: Controlled on losartan  50 mg once a day.  The losartan  was refilled on 07/12/2024.  Orders: -     EKG 12-Lead -     Microalbumin / creatinine urine ratio -     Urinalysis, Complete -     Comprehensive metabolic panel with GFR -     Losartan  Potassium; TAKE 1 TABLET(50 MG) BY MOUTH DAILY  Dispense: 90 tablet; Refill: 2  Abnormal glucose Assessment & Plan: A CMP and a hemoglobin A1c was ordered on 07/12/2024.  Orders: -     Hemoglobin A1c  High triglycerides Assessment & Plan: His lipid panel was done on 07/12/2024.  Orders: -     Lipid panel  Influenza vaccination declined  Class 2 obesity due to excess calories with body mass index (BMI) of 38.0 to 38.9 in adult, unspecified whether serious comorbidity present  History of gout Assessment & Plan: A uric acid level was done on 07/12/2024.  Orders: -     Uric acid  Essential hypertension Assessment & Plan: Controlled on losartan  50 mg once a day.  The losartan  was refilled on 07/12/2024.  Orders: -     EKG 12-Lead -     Microalbumin / creatinine urine ratio -     Urinalysis, Complete -     Comprehensive metabolic panel with GFR -      Losartan  Potassium; TAKE 1 TABLET(50 MG) BY MOUTH DAILY  Dispense: 90 tablet; Refill: 2  Acute gout of left  shoulder, unspecified cause -     Colchicine ; Take 1 tablet by mouth as needed BID  Dispense: 90 tablet; Refill: 1  Snoring     Return in 6 months (on 01/09/2025), or Hypertension-labs from 07/12/2024, for 1 year physical, 6 month bp check. Patient was given opportunity to ask questions. Patient verbalized understanding of the plan and was able to repeat key elements of the plan. All questions were answered to their satisfaction.   Gaither JONELLE Fischer, DO  I, Gaither JONELLE Fischer, DO, have reviewed all documentation for this visit. The documentation on 07/12/24 for the exam, diagnosis, procedures, and orders are all accurate and complete.          [1]  Current Outpatient Medications:    cetirizine (ZYRTEC) 10 MG tablet, Take 10 mg by mouth daily as needed for allergies., Disp: , Rfl:    colchicine  0.6 MG tablet, Take 1 tablet by mouth as needed BID, Disp: 90 tablet, Rfl: 1   losartan  (COZAAR ) 50 MG tablet, TAKE 1 TABLET(50 MG) BY MOUTH DAILY, Disp: 90 tablet, Rfl: 2   Magnesium  Gluconate 250 MG TABS, Take 1 tablet (250 mg total) by mouth at bedtime as needed. (Patient not taking: Reported on 07/12/2024), Disp: 30 tablet, Rfl: 5 [2] No Known Allergies [3] Social History Tobacco Use  Smoking Status Never  Smokeless Tobacco Never  "

## 2024-07-12 NOTE — Assessment & Plan Note (Signed)
 A CMP and a hemoglobin A1c was ordered on 07/12/2024.

## 2024-07-12 NOTE — Assessment & Plan Note (Signed)
 Controlled on losartan  50 mg once a day.  The losartan  was refilled on 07/12/2024.

## 2024-07-12 NOTE — Addendum Note (Signed)
 Addended by: BERNARDO CARWIN on: 07/12/2024 04:16 PM   Modules accepted: Level of Service

## 2024-07-12 NOTE — Assessment & Plan Note (Signed)
 His lipid panel was done on 07/12/2024.

## 2024-07-12 NOTE — Assessment & Plan Note (Signed)
 The EKG done on 07/12/2024 showed a normal sinus rhythm with a left axis deviation and an anterior fascicular block.

## 2024-07-13 ENCOUNTER — Ambulatory Visit: Payer: Self-pay

## 2024-07-13 DIAGNOSIS — R748 Abnormal levels of other serum enzymes: Secondary | ICD-10-CM

## 2024-07-13 DIAGNOSIS — R7303 Prediabetes: Secondary | ICD-10-CM

## 2024-07-13 LAB — COMPREHENSIVE METABOLIC PANEL WITH GFR
ALT: 30 IU/L (ref 0–44)
AST: 23 IU/L (ref 0–40)
Albumin: 4.4 g/dL (ref 4.1–5.1)
Alkaline Phosphatase: 158 IU/L — ABNORMAL HIGH (ref 47–123)
BUN/Creatinine Ratio: 8 — ABNORMAL LOW (ref 9–20)
BUN: 9 mg/dL (ref 6–24)
Bilirubin Total: 0.5 mg/dL (ref 0.0–1.2)
CO2: 23 mmol/L (ref 20–29)
Calcium: 9.5 mg/dL (ref 8.7–10.2)
Chloride: 103 mmol/L (ref 96–106)
Creatinine, Ser: 1.09 mg/dL (ref 0.76–1.27)
Globulin, Total: 3.2 g/dL (ref 1.5–4.5)
Glucose: 87 mg/dL (ref 70–99)
Potassium: 4.8 mmol/L (ref 3.5–5.2)
Sodium: 142 mmol/L (ref 134–144)
Total Protein: 7.6 g/dL (ref 6.0–8.5)
eGFR: 86 mL/min/1.73

## 2024-07-13 LAB — URINALYSIS, COMPLETE
Bilirubin, UA: NEGATIVE
Glucose, UA: NEGATIVE
Ketones, UA: NEGATIVE
Leukocytes,UA: NEGATIVE
Nitrite, UA: NEGATIVE
RBC, UA: NEGATIVE
Specific Gravity, UA: 1.021 (ref 1.005–1.030)
Urobilinogen, Ur: 1 mg/dL (ref 0.2–1.0)
pH, UA: 6.5 (ref 5.0–7.5)

## 2024-07-13 LAB — LIPID PANEL
Chol/HDL Ratio: 3 ratio (ref 0.0–5.0)
Cholesterol, Total: 169 mg/dL (ref 100–199)
HDL: 56 mg/dL
LDL Chol Calc (NIH): 88 mg/dL (ref 0–99)
Triglycerides: 145 mg/dL (ref 0–149)
VLDL Cholesterol Cal: 25 mg/dL (ref 5–40)

## 2024-07-13 LAB — MICROSCOPIC EXAMINATION
Bacteria, UA: NONE SEEN
Casts: NONE SEEN /LPF
Epithelial Cells (non renal): NONE SEEN /HPF (ref 0–10)
WBC, UA: NONE SEEN /HPF (ref 0–5)

## 2024-07-13 LAB — HEMOGLOBIN A1C
Est. average glucose Bld gHb Est-mCnc: 117 mg/dL
Hgb A1c MFr Bld: 5.7 % — ABNORMAL HIGH (ref 4.8–5.6)

## 2024-07-13 LAB — URIC ACID: Uric Acid: 8.1 mg/dL (ref 3.8–8.4)

## 2024-07-13 LAB — MICROALBUMIN / CREATININE URINE RATIO
Creatinine, Urine: 230.8 mg/dL
Microalb/Creat Ratio: 7 mg/g{creat} (ref 0–29)
Microalbumin, Urine: 17.3 ug/mL

## 2024-07-13 NOTE — Addendum Note (Signed)
 Addended by: BERNARDO CARWIN on: 07/13/2024 09:25 AM   Modules accepted: Level of Service

## 2024-12-27 ENCOUNTER — Ambulatory Visit: Payer: Self-pay | Admitting: Nurse Practitioner

## 2025-07-14 ENCOUNTER — Encounter: Payer: Self-pay | Admitting: Nurse Practitioner

## 2025-07-25 ENCOUNTER — Encounter: Payer: Self-pay | Admitting: Nurse Practitioner
